# Patient Record
Sex: Male | Born: 1960 | Race: Black or African American | Hispanic: No | Marital: Married | Smoking: Never smoker
Health system: Southern US, Community
[De-identification: ages and names within clinical notes are randomized; demographics above are authoritative.]

## PROBLEM LIST (undated history)

## (undated) DIAGNOSIS — M1990 Unspecified osteoarthritis, unspecified site: Secondary | ICD-10-CM

## (undated) DIAGNOSIS — K219 Gastro-esophageal reflux disease without esophagitis: Secondary | ICD-10-CM

## (undated) DIAGNOSIS — Z87442 Personal history of urinary calculi: Secondary | ICD-10-CM

## (undated) DIAGNOSIS — I10 Essential (primary) hypertension: Secondary | ICD-10-CM

## (undated) DIAGNOSIS — E785 Hyperlipidemia, unspecified: Secondary | ICD-10-CM

## (undated) DIAGNOSIS — D649 Anemia, unspecified: Secondary | ICD-10-CM

## (undated) HISTORY — DX: Hyperlipidemia, unspecified: E78.5

## (undated) HISTORY — DX: Unspecified osteoarthritis, unspecified site: M19.90

## (undated) HISTORY — DX: Gastro-esophageal reflux disease without esophagitis: K21.9

## (undated) HISTORY — PX: WISDOM TOOTH EXTRACTION: SHX21

## (undated) HISTORY — PX: OTHER SURGICAL HISTORY: SHX169

---

## 2004-07-22 ENCOUNTER — Ambulatory Visit (HOSPITAL_COMMUNITY): Admission: RE | Admit: 2004-07-22 | Discharge: 2004-07-22 | Payer: Self-pay | Admitting: *Deleted

## 2014-07-10 DIAGNOSIS — M069 Rheumatoid arthritis, unspecified: Secondary | ICD-10-CM | POA: Insufficient documentation

## 2014-07-10 DIAGNOSIS — I10 Essential (primary) hypertension: Secondary | ICD-10-CM | POA: Insufficient documentation

## 2014-07-10 DIAGNOSIS — J302 Other seasonal allergic rhinitis: Secondary | ICD-10-CM | POA: Insufficient documentation

## 2014-08-16 ENCOUNTER — Encounter: Payer: Self-pay | Admitting: Internal Medicine

## 2014-08-16 ENCOUNTER — Ambulatory Visit (INDEPENDENT_AMBULATORY_CARE_PROVIDER_SITE_OTHER): Payer: Managed Care, Other (non HMO) | Admitting: Internal Medicine

## 2014-08-16 DIAGNOSIS — Z9189 Other specified personal risk factors, not elsewhere classified: Secondary | ICD-10-CM

## 2014-08-16 DIAGNOSIS — Z23 Encounter for immunization: Secondary | ICD-10-CM

## 2014-08-16 MED ORDER — CIPROFLOXACIN HCL 500 MG PO TABS: 500.0000 mg | ORAL_TABLET | Freq: Two times a day (BID) | ORAL | Status: DC

## 2014-08-16 NOTE — Progress Notes (Signed)
   Subjective:    Patient ID: Alex Townsend, male    DOB: May 21, 1961, 53 y.o.   MRN: [id number]  HPI 52yo M with upcoming trip to [place] and [place]. He will spend [place] in [place]. Also planning to go to [place] next year  All : nkma Meds: remicade, leflunomide, crestor, fenofibrate, micardis, clonidine, bystolic, amlodipine  Pmhx: RA, hypercholesteremia, GERD  Previous travel to [place] and [place] Prior vax = flu vaccine, tdap in 2013    Review of Systems     Objective:   Physical Exam        Assessment & Plan:  imms = recommended hep a and typhoid, he is interested in getting Hep A #1 now. No need for yellow fever on this itinerary. Recommend he gets flu vaccine this winter  Malaria proph = not needed on this itinerary. But recommend to use deet  Travelers diarrhea = will give rx for cipro

## 2014-12-25 DIAGNOSIS — D509 Iron deficiency anemia, unspecified: Secondary | ICD-10-CM | POA: Insufficient documentation

## 2015-02-06 DIAGNOSIS — N401 Enlarged prostate with lower urinary tract symptoms: Secondary | ICD-10-CM | POA: Insufficient documentation

## 2015-02-21 ENCOUNTER — Ambulatory Visit (INDEPENDENT_AMBULATORY_CARE_PROVIDER_SITE_OTHER): Payer: Managed Care, Other (non HMO) | Admitting: *Deleted

## 2015-02-21 DIAGNOSIS — Z23 Encounter for immunization: Secondary | ICD-10-CM

## 2015-07-31 DIAGNOSIS — E349 Endocrine disorder, unspecified: Secondary | ICD-10-CM | POA: Insufficient documentation

## 2015-10-07 DIAGNOSIS — F5104 Psychophysiologic insomnia: Secondary | ICD-10-CM | POA: Insufficient documentation

## 2015-10-07 DIAGNOSIS — G4733 Obstructive sleep apnea (adult) (pediatric): Secondary | ICD-10-CM | POA: Insufficient documentation

## 2016-08-03 DIAGNOSIS — E785 Hyperlipidemia, unspecified: Secondary | ICD-10-CM | POA: Insufficient documentation

## 2016-11-23 DIAGNOSIS — N200 Calculus of kidney: Secondary | ICD-10-CM | POA: Insufficient documentation

## 2017-04-07 DIAGNOSIS — R232 Flushing: Secondary | ICD-10-CM | POA: Insufficient documentation

## 2019-05-10 DIAGNOSIS — M4800 Spinal stenosis, site unspecified: Secondary | ICD-10-CM | POA: Insufficient documentation

## 2019-05-17 DIAGNOSIS — M5416 Radiculopathy, lumbar region: Secondary | ICD-10-CM | POA: Insufficient documentation

## 2019-06-05 DIAGNOSIS — Z0000 Encounter for general adult medical examination without abnormal findings: Secondary | ICD-10-CM | POA: Insufficient documentation

## 2021-03-06 ENCOUNTER — Other Ambulatory Visit: Payer: Self-pay | Admitting: Neurosurgery

## 2021-03-11 NOTE — Pre-Procedure Instructions (Signed)
Surgical Instructions:    Your procedure is scheduled on Monday, 03/16/21 (11 AM- 2:13 PM).  Report to [facility] Main Entrance "A" at 09:00 A.M., then check in with the Admitting office.  Call this number if you have any questions prior to, or have any problems the morning of surgery:  [phone]    Remember:  Do not eat or drink after midnight the night before your surgery.      Take these medicines the morning of surgery with A SIP OF WATER: atorvastatin (LIPITOR) cetirizine (ZYRTEC) hydrALAZINE (APRESOLINE) labetalol (NORMODYNE) NIFEdipine (ADALAT CC)    STOP leflunomide (ARAVA), and RINVOQ 3 days prior to surgery.   As of today, STOP taking any Aspirin (unless otherwise instructed by your surgeon) Aleve, Naproxen, Ibuprofen, Motrin, Advil, Goody's, BC's, all herbal medications, fish oil, and all vitamins.              Special instructions:   [place]- Preparing For Surgery  Before surgery, you can play an important role. Because skin is not sterile, your skin needs to be as free of germs as possible. You can reduce the number of germs on your skin by washing with CHG (chlorahexidine gluconate) Soap before surgery.  CHG is an antiseptic cleaner which kills germs and bonds with the skin to continue killing germs even after washing.    Oral Hygiene is also important to reduce your risk of infection.  Remember - BRUSH YOUR TEETH THE MORNING OF SURGERY WITH YOUR REGULAR TOOTHPASTE  Please do not use if you have an allergy to CHG or antibacterial soaps. If your skin becomes reddened/irritated stop using the CHG.  Do not shave (including legs and underarms) for at least 48 hours prior to first CHG shower. It is OK to shave your face.  Please follow these instructions carefully.   1. Shower the NIGHT BEFORE SURGERY and the MORNING OF SURGERY  2. If you chose to wash your hair, wash your hair first as usual with your normal shampoo.  3. After you shampoo, rinse your  hair and body thoroughly to remove the shampoo.  4. Wash Face and genitals (private parts) with your normal soap.   5. Use CHG Soap as you would any other liquid soap. You can apply CHG directly to the skin and wash gently with a scrungie or a clean washcloth.   6. Apply the CHG Soap to your body ONLY FROM THE NECK DOWN.  Do not use on open wounds or open sores. Avoid contact with your eyes, ears, mouth and genitals (private parts). Wash Face and genitals (private parts)  with your normal soap.   7. Wash thoroughly, paying special attention to the area where your surgery will be performed.  8. Thoroughly rinse your body with warm water from the neck down.  9. DO NOT shower/wash with your normal soap after using and rinsing off the CHG Soap.  10. Pat yourself dry with a CLEAN TOWEL.  11. Wear CLEAN PAJAMAS to bed the night before surgery.  12. Place CLEAN SHEETS on your bed the night before your surgery.  13. DO NOT SLEEP WITH PETS.   Day of Surgery: SHOWER with CHG soap. Brush your teeth WITH YOUR REGULAR TOOTHPASTE. Wear Clean/Comfortable clothing the morning of surgery. Do not apply any deodorants/lotions.   Do not wear jewelry. Men may shave face and neck.  Do NOT Smoke (Tobacco/Vaping) or drink Alcohol 24 hours prior to your procedure. Do not bring valuables to the hospital. [facility] is not responsible for any belongings or valuables.  If you use a CPAP at night, you may bring all equipment for your overnight stay.   Contacts, glasses, or dentures may not be worn into surgery, please bring cases for these belongings.   For patients admitted to the hospital, discharge time will be determined by your treatment team.   Patients discharged the day of surgery will not be allowed to drive home, and someone needs to stay with them for 24 hours.    Please read over the following fact sheets that you were given.

## 2021-03-12 ENCOUNTER — Other Ambulatory Visit: Payer: Self-pay

## 2021-03-12 ENCOUNTER — Encounter (HOSPITAL_COMMUNITY)
Admission: RE | Admit: 2021-03-12 | Discharge: 2021-03-12 | Disposition: A | Discharge status: Home / Self Care | Source: Ambulatory Visit | Attending: Neurosurgery | Admitting: Neurosurgery

## 2021-03-12 ENCOUNTER — Encounter (HOSPITAL_COMMUNITY): Payer: Self-pay

## 2021-03-12 DIAGNOSIS — Z20822 Contact with and (suspected) exposure to covid-19: Secondary | ICD-10-CM | POA: Diagnosis not present

## 2021-03-12 DIAGNOSIS — Z01818 Encounter for other preprocedural examination: Secondary | ICD-10-CM | POA: Insufficient documentation

## 2021-03-12 HISTORY — DX: Personal history of urinary calculi: Z87.442

## 2021-03-12 HISTORY — DX: Essential (primary) hypertension: I10

## 2021-03-12 HISTORY — DX: Anemia, unspecified: D64.9

## 2021-03-12 LAB — BASIC METABOLIC PANEL
Anion gap: 6 (ref 5–15)
BUN: 16 mg/dL (ref 6–20)
CO2: 26 mmol/L (ref 22–32)
Calcium: 8.8 mg/dL — ABNORMAL LOW (ref 8.9–10.3)
Chloride: 105 mmol/L (ref 98–111)
Creatinine, Ser: 1.13 mg/dL (ref 0.61–1.24)
GFR, Estimated: >60 mL/min (ref >60)
Glucose, Bld: 98 mg/dL (ref 70–99)
Potassium: 3.8 mmol/L (ref 3.5–5.1)
Sodium: 137 mmol/L (ref 135–145)

## 2021-03-12 LAB — SARS CORONAVIRUS 2 (TAT 6-24 HRS): SARS Coronavirus 2: NEGATIVE

## 2021-03-12 LAB — CBC
HCT: 33.2 % — ABNORMAL LOW (ref 39.0–52.0)
Hemoglobin: 9.9 g/dL — ABNORMAL LOW (ref 13.0–17.0)
MCH: 22.6 pg — ABNORMAL LOW (ref 26.0–34.0)
MCHC: 29.8 g/dL — ABNORMAL LOW (ref 30.0–36.0)
MCV: 75.6 fL — ABNORMAL LOW (ref 80.0–100.0)
Platelets: 385 10*3/uL (ref 150–400)
RBC: 4.39 MIL/uL (ref 4.22–5.81)
RDW: 15 % (ref 11.5–15.5)
WBC: 5.7 10*3/uL (ref 4.0–10.5)
nRBC: 0 % (ref 0.0–0.2)

## 2021-03-12 LAB — TYPE AND SCREEN
ABO/RH(D): B POS
Antibody Screen: NEGATIVE

## 2021-03-12 LAB — SURGICAL PCR SCREEN
MRSA, PCR: NEGATIVE
Staphylococcus aureus: NEGATIVE

## 2021-03-12 NOTE — Progress Notes (Signed)
PCP - Bernadette Hoit Cardiologist - none Nephrologist: Mcneil Sober Urologist: Dr. Ernestine Conrad  PPM/ICD - denies   Chest x-ray - n/a EKG - 03/12/21 Stress Test - denies ECHO - denies Cardiac Cath - denies  Sleep Study - Has had a sleep study in the past. Not diagnosed with sleep apnea and never was prescribed a cpap.    No Diabetes  Patient instructed to hold all Aspirin, NSAID's, herbal medications, fish oil and vitamins 7 days prior to surgery.   STOP leflunomide (ARAVA), and RINVOQ 3 days prior to surgery.   ERAS Protcol -no   COVID TEST- 03/12/21   Anesthesia review: no  Patient denies shortness of breath, fever, cough and chest pain at PAT appointment   All instructions explained to the patient, with a verbal understanding of the material. Patient agrees to go over the instructions while at home for a better understanding. Patient also instructed to self quarantine after being tested for COVID-19. The opportunity to ask questions was provided.

## 2021-03-16 ENCOUNTER — Encounter (HOSPITAL_COMMUNITY)
Admission: RE | Disposition: A | Discharge status: Home / Self Care | Payer: Self-pay | Source: Home / Self Care | Attending: Neurosurgery

## 2021-03-16 ENCOUNTER — Inpatient Hospital Stay (HOSPITAL_COMMUNITY): Admitting: Certified Registered"

## 2021-03-16 ENCOUNTER — Ambulatory Visit (HOSPITAL_COMMUNITY)
Admission: RE | Admit: 2021-03-16 | Discharge: 2021-03-17 | Disposition: A | Discharge status: Home / Self Care | Attending: Neurosurgery | Admitting: Neurosurgery

## 2021-03-16 ENCOUNTER — Other Ambulatory Visit: Payer: Self-pay

## 2021-03-16 ENCOUNTER — Encounter (HOSPITAL_COMMUNITY): Payer: Self-pay | Admitting: Neurosurgery

## 2021-03-16 ENCOUNTER — Inpatient Hospital Stay (HOSPITAL_COMMUNITY)

## 2021-03-16 DIAGNOSIS — Z880 Allergy status to penicillin: Secondary | ICD-10-CM | POA: Insufficient documentation

## 2021-03-16 DIAGNOSIS — M532X6 Spinal instabilities, lumbar region: Secondary | ICD-10-CM | POA: Diagnosis not present

## 2021-03-16 DIAGNOSIS — M48061 Spinal stenosis, lumbar region without neurogenic claudication: Secondary | ICD-10-CM | POA: Diagnosis not present

## 2021-03-16 DIAGNOSIS — Z888 Allergy status to other drugs, medicaments and biological substances status: Secondary | ICD-10-CM | POA: Diagnosis not present

## 2021-03-16 DIAGNOSIS — M5126 Other intervertebral disc displacement, lumbar region: Secondary | ICD-10-CM | POA: Diagnosis not present

## 2021-03-16 DIAGNOSIS — Z79899 Other long term (current) drug therapy: Secondary | ICD-10-CM | POA: Diagnosis not present

## 2021-03-16 DIAGNOSIS — Z419 Encounter for procedure for purposes other than remedying health state, unspecified: Secondary | ICD-10-CM

## 2021-03-16 LAB — ABO/RH: ABO/RH(D): B POS

## 2021-03-16 SURGERY — POSTERIOR LUMBAR FUSION 1 LEVEL
Anesthesia: General | Site: Back

## 2021-03-16 MED ORDER — SODIUM CHLORIDE 0.9% FLUSH: 3.0000 mL | INTRAVENOUS | Status: DC | PRN

## 2021-03-16 MED ORDER — ROCURONIUM BROMIDE 10 MG/ML (PF) SYRINGE
PREFILLED_SYRINGE | INTRAVENOUS | Status: AC
  Filled 2021-03-16: qty 10

## 2021-03-16 MED ORDER — HYDRALAZINE HCL 10 MG PO TABS
50.0000 mg | ORAL_TABLET | Freq: Three times a day (TID) | ORAL | Status: DC
  Administered 2021-03-16 – 2021-03-17 (×3): 50 mg via ORAL
  Filled 2021-03-16 (×3): qty 5

## 2021-03-16 MED ORDER — CEFAZOLIN SODIUM-DEXTROSE 2-4 GM/100ML-% IV SOLN
2.0000 g | Freq: Three times a day (TID) | INTRAVENOUS | Status: AC
  Administered 2021-03-16 – 2021-03-17 (×2): 2 g via INTRAVENOUS
  Filled 2021-03-16 (×2): qty 100

## 2021-03-16 MED ORDER — FENTANYL CITRATE (PF) 100 MCG/2ML IJ SOLN
INTRAMUSCULAR | Status: AC
  Filled 2021-03-16: qty 2

## 2021-03-16 MED ORDER — TRAZODONE HCL 50 MG PO TABS
50.0000 mg | ORAL_TABLET | Freq: Every day | ORAL | Status: DC
  Administered 2021-03-16: 50 mg via ORAL
  Filled 2021-03-16 (×2): qty 1

## 2021-03-16 MED ORDER — PHENYLEPHRINE HCL-NACL 10-0.9 MG/250ML-% IV SOLN
INTRAVENOUS | Status: DC | PRN
  Administered 2021-03-16: 25 ug/min via INTRAVENOUS

## 2021-03-16 MED ORDER — CHLORHEXIDINE GLUCONATE CLOTH 2 % EX PADS: 6.0000 | MEDICATED_PAD | Freq: Once | CUTANEOUS | Status: DC

## 2021-03-16 MED ORDER — VITAMIN D 25 MCG (1000 UNIT) PO TABS
1000.0000 [IU] | ORAL_TABLET | Freq: Every day | ORAL | Status: DC
  Administered 2021-03-17: 1000 [IU] via ORAL
  Filled 2021-03-16: qty 1

## 2021-03-16 MED ORDER — BUPIVACAINE LIPOSOME 1.3 % IJ SUSP
INTRAMUSCULAR | Status: DC | PRN
  Administered 2021-03-16: 20 mL

## 2021-03-16 MED ORDER — LABETALOL HCL 200 MG PO TABS
200.0000 mg | ORAL_TABLET | Freq: Two times a day (BID) | ORAL | Status: DC
  Administered 2021-03-16 – 2021-03-17 (×2): 200 mg via ORAL
  Filled 2021-03-16 (×3): qty 1

## 2021-03-16 MED ORDER — CEFAZOLIN SODIUM-DEXTROSE 2-4 GM/100ML-% IV SOLN
2.0000 g | INTRAVENOUS | Status: AC
  Administered 2021-03-16: 2 g via INTRAVENOUS

## 2021-03-16 MED ORDER — LORATADINE 10 MG PO TABS
10.0000 mg | ORAL_TABLET | Freq: Every day | ORAL | Status: DC
  Administered 2021-03-17: 10 mg via ORAL
  Filled 2021-03-16: qty 1

## 2021-03-16 MED ORDER — SODIUM CHLORIDE 0.9% FLUSH: 3.0000 mL | Freq: Two times a day (BID) | INTRAVENOUS | Status: DC

## 2021-03-16 MED ORDER — IRBESARTAN 150 MG PO TABS
75.0000 mg | ORAL_TABLET | Freq: Every day | ORAL | Status: DC
  Administered 2021-03-17: 75 mg via ORAL
  Filled 2021-03-16: qty 1

## 2021-03-16 MED ORDER — LIDOCAINE 2% (20 MG/ML) 5 ML SYRINGE
INTRAMUSCULAR | Status: AC
  Filled 2021-03-16: qty 5

## 2021-03-16 MED ORDER — CHLORHEXIDINE GLUCONATE 0.12 % MT SOLN: 15.0000 mL | Freq: Once | OROMUCOSAL | Status: AC

## 2021-03-16 MED ORDER — MAGNESIUM OXIDE -MG SUPPLEMENT 400 (240 MG) MG PO TABS
400.0000 mg | ORAL_TABLET | Freq: Every day | ORAL | Status: DC
  Administered 2021-03-17: 400 mg via ORAL
  Filled 2021-03-16: qty 1

## 2021-03-16 MED ORDER — LACTATED RINGERS IV SOLN: INTRAVENOUS | Status: DC

## 2021-03-16 MED ORDER — LIDOCAINE 2% (20 MG/ML) 5 ML SYRINGE
INTRAMUSCULAR | Status: DC | PRN
  Administered 2021-03-16: 60 mg via INTRAVENOUS

## 2021-03-16 MED ORDER — HYDROMORPHONE HCL 1 MG/ML IJ SOLN
INTRAMUSCULAR | Status: AC
  Filled 2021-03-16: qty 1

## 2021-03-16 MED ORDER — OXYCODONE HCL 5 MG PO TABS
ORAL_TABLET | ORAL | Status: AC
  Filled 2021-03-16: qty 1

## 2021-03-16 MED ORDER — ACETAMINOPHEN 650 MG RE SUPP: 650.0000 mg | RECTAL | Status: DC | PRN

## 2021-03-16 MED ORDER — SUFENTANIL CITRATE 50 MCG/ML IV SOLN
INTRAVENOUS | Status: DC | PRN
  Administered 2021-03-16: 20 ug via INTRAVENOUS

## 2021-03-16 MED ORDER — MIDAZOLAM HCL 2 MG/2ML IJ SOLN
INTRAMUSCULAR | Status: AC
  Filled 2021-03-16: qty 2

## 2021-03-16 MED ORDER — ATORVASTATIN CALCIUM 40 MG PO TABS
40.0000 mg | ORAL_TABLET | Freq: Every day | ORAL | Status: DC
  Administered 2021-03-17: 40 mg via ORAL
  Filled 2021-03-16: qty 1

## 2021-03-16 MED ORDER — ALUM & MAG HYDROXIDE-SIMETH 200-200-20 MG/5ML PO SUSP: 30.0000 mL | Freq: Four times a day (QID) | ORAL | Status: DC | PRN

## 2021-03-16 MED ORDER — CEFAZOLIN SODIUM-DEXTROSE 2-4 GM/100ML-% IV SOLN
INTRAVENOUS | Status: AC
  Filled 2021-03-16: qty 100

## 2021-03-16 MED ORDER — PHENOL 1.4 % MT LIQD: 1.0000 | OROMUCOSAL | Status: DC | PRN

## 2021-03-16 MED ORDER — EPHEDRINE SULFATE-NACL 50-0.9 MG/10ML-% IV SOSY
PREFILLED_SYRINGE | INTRAVENOUS | Status: DC | PRN
  Administered 2021-03-16 (×2): 5 mg via INTRAVENOUS

## 2021-03-16 MED ORDER — HYDROMORPHONE HCL 1 MG/ML IJ SOLN
0.5000 mg | INTRAMUSCULAR | Status: DC | PRN
  Administered 2021-03-17: 0.5 mg via INTRAVENOUS
  Filled 2021-03-16: qty 0.5

## 2021-03-16 MED ORDER — ROCURONIUM BROMIDE 10 MG/ML (PF) SYRINGE
PREFILLED_SYRINGE | INTRAVENOUS | Status: DC | PRN
  Administered 2021-03-16: 50 mg via INTRAVENOUS

## 2021-03-16 MED ORDER — THROMBIN 20000 UNITS EX SOLR
CUTANEOUS | Status: DC | PRN
  Administered 2021-03-16: 20 mL via TOPICAL

## 2021-03-16 MED ORDER — FENTANYL CITRATE (PF) 100 MCG/2ML IJ SOLN
25.0000 ug | INTRAMUSCULAR | Status: DC | PRN
  Administered 2021-03-16 (×3): 50 ug via INTRAVENOUS

## 2021-03-16 MED ORDER — OXYCODONE HCL 5 MG PO TABS
5.0000 mg | ORAL_TABLET | Freq: Once | ORAL | Status: AC | PRN
  Administered 2021-03-16: 5 mg via ORAL

## 2021-03-16 MED ORDER — UPADACITINIB ER 15 MG PO TB24: 15.0000 mg | ORAL_TABLET | Freq: Every day | ORAL | Status: DC

## 2021-03-16 MED ORDER — PANTOPRAZOLE SODIUM 40 MG PO TBEC
40.0000 mg | DELAYED_RELEASE_TABLET | Freq: Every day | ORAL | Status: DC
  Administered 2021-03-16: 40 mg via ORAL
  Filled 2021-03-16: qty 1

## 2021-03-16 MED ORDER — MENTHOL 3 MG MT LOZG: 1.0000 | LOZENGE | OROMUCOSAL | Status: DC | PRN

## 2021-03-16 MED ORDER — HYDROMORPHONE HCL 1 MG/ML IJ SOLN
0.2500 mg | INTRAMUSCULAR | Status: DC | PRN
  Administered 2021-03-16 (×2): 0.5 mg via INTRAVENOUS

## 2021-03-16 MED ORDER — ORAL CARE MOUTH RINSE: 15.0000 mL | Freq: Once | OROMUCOSAL | Status: AC

## 2021-03-16 MED ORDER — BUPIVACAINE HCL (PF) 0.25 % IJ SOLN
INTRAMUSCULAR | Status: AC
  Filled 2021-03-16: qty 30

## 2021-03-16 MED ORDER — NIFEDIPINE ER OSMOTIC RELEASE 90 MG PO TB24
90.0000 mg | ORAL_TABLET | Freq: Every day | ORAL | Status: DC
  Administered 2021-03-17: 90 mg via ORAL
  Filled 2021-03-16: qty 1

## 2021-03-16 MED ORDER — ONDANSETRON HCL 4 MG/2ML IJ SOLN
INTRAMUSCULAR | Status: DC | PRN
  Administered 2021-03-16: 4 mg via INTRAVENOUS

## 2021-03-16 MED ORDER — LACTATED RINGERS IV SOLN: INTRAVENOUS | Status: DC | PRN

## 2021-03-16 MED ORDER — THROMBIN 5000 UNITS EX SOLR
CUTANEOUS | Status: AC
  Filled 2021-03-16: qty 5000

## 2021-03-16 MED ORDER — DEXAMETHASONE SODIUM PHOSPHATE 10 MG/ML IJ SOLN
10.0000 mg | Freq: Once | INTRAMUSCULAR | Status: AC
  Administered 2021-03-16: 10 mg via INTRAVENOUS

## 2021-03-16 MED ORDER — OXYCODONE HCL 5 MG/5ML PO SOLN: 5.0000 mg | Freq: Once | ORAL | Status: AC | PRN

## 2021-03-16 MED ORDER — SUGAMMADEX SODIUM 200 MG/2ML IV SOLN
INTRAVENOUS | Status: DC | PRN
  Administered 2021-03-16: 200 mg via INTRAVENOUS

## 2021-03-16 MED ORDER — ACETAMINOPHEN 325 MG PO TABS
650.0000 mg | ORAL_TABLET | ORAL | Status: DC | PRN
  Administered 2021-03-16 – 2021-03-17 (×2): 650 mg via ORAL
  Filled 2021-03-16 (×2): qty 2

## 2021-03-16 MED ORDER — PANTOPRAZOLE SODIUM 40 MG IV SOLR: 40.0000 mg | Freq: Every day | INTRAVENOUS | Status: DC

## 2021-03-16 MED ORDER — PROPOFOL 10 MG/ML IV BOLUS
INTRAVENOUS | Status: DC | PRN
  Administered 2021-03-16: 200 mg via INTRAVENOUS

## 2021-03-16 MED ORDER — ONDANSETRON HCL 4 MG/2ML IJ SOLN
INTRAMUSCULAR | Status: AC
  Filled 2021-03-16: qty 2

## 2021-03-16 MED ORDER — 0.9 % SODIUM CHLORIDE (POUR BTL) OPTIME
TOPICAL | Status: DC | PRN
  Administered 2021-03-16: 1000 mL

## 2021-03-16 MED ORDER — SODIUM CHLORIDE 0.9 % IV SOLN
250.0000 mL | INTRAVENOUS | Status: DC
  Administered 2021-03-16: 250 mL via INTRAVENOUS

## 2021-03-16 MED ORDER — CYCLOBENZAPRINE HCL 10 MG PO TABS
ORAL_TABLET | ORAL | Status: AC
  Filled 2021-03-16: qty 1

## 2021-03-16 MED ORDER — AMILORIDE HCL 5 MG PO TABS
5.0000 mg | ORAL_TABLET | Freq: Every day | ORAL | Status: DC
  Administered 2021-03-17: 5 mg via ORAL
  Filled 2021-03-16: qty 1

## 2021-03-16 MED ORDER — PROMETHAZINE HCL 25 MG/ML IJ SOLN: 6.2500 mg | INTRAMUSCULAR | Status: DC | PRN

## 2021-03-16 MED ORDER — CYCLOBENZAPRINE HCL 10 MG PO TABS
10.0000 mg | ORAL_TABLET | Freq: Three times a day (TID) | ORAL | Status: DC | PRN
  Administered 2021-03-16 – 2021-03-17 (×2): 10 mg via ORAL
  Filled 2021-03-16: qty 1

## 2021-03-16 MED ORDER — ONDANSETRON HCL 4 MG PO TABS: 4.0000 mg | ORAL_TABLET | Freq: Four times a day (QID) | ORAL | Status: DC | PRN

## 2021-03-16 MED ORDER — BUPIVACAINE LIPOSOME 1.3 % IJ SUSP
INTRAMUSCULAR | Status: AC
  Filled 2021-03-16: qty 20

## 2021-03-16 MED ORDER — LEFLUNOMIDE 20 MG PO TABS
20.0000 mg | ORAL_TABLET | Freq: Every day | ORAL | Status: DC
  Filled 2021-03-16: qty 1

## 2021-03-16 MED ORDER — DEXAMETHASONE SODIUM PHOSPHATE 10 MG/ML IJ SOLN
INTRAMUSCULAR | Status: AC
  Filled 2021-03-16: qty 1

## 2021-03-16 MED ORDER — EPHEDRINE 5 MG/ML INJ
INTRAVENOUS | Status: AC
  Filled 2021-03-16: qty 10

## 2021-03-16 MED ORDER — PROPOFOL 10 MG/ML IV BOLUS
INTRAVENOUS | Status: AC
  Filled 2021-03-16: qty 20

## 2021-03-16 MED ORDER — SUFENTANIL CITRATE 50 MCG/ML IV SOLN
INTRAVENOUS | Status: AC
  Filled 2021-03-16: qty 1

## 2021-03-16 MED ORDER — CHLORHEXIDINE GLUCONATE 0.12 % MT SOLN
OROMUCOSAL | Status: AC
  Administered 2021-03-16: 15 mL via OROMUCOSAL
  Filled 2021-03-16: qty 15

## 2021-03-16 MED ORDER — THROMBIN 20000 UNITS EX SOLR
CUTANEOUS | Status: AC
  Filled 2021-03-16: qty 20000

## 2021-03-16 MED ORDER — OXYCODONE HCL 5 MG PO TABS
10.0000 mg | ORAL_TABLET | ORAL | Status: DC | PRN
  Administered 2021-03-16 – 2021-03-17 (×6): 10 mg via ORAL
  Filled 2021-03-16 (×6): qty 2

## 2021-03-16 MED ORDER — MIDAZOLAM HCL 2 MG/2ML IJ SOLN
INTRAMUSCULAR | Status: DC | PRN
  Administered 2021-03-16: 2 mg via INTRAVENOUS

## 2021-03-16 MED ORDER — ONDANSETRON HCL 4 MG/2ML IJ SOLN: 4.0000 mg | Freq: Four times a day (QID) | INTRAMUSCULAR | Status: DC | PRN

## 2021-03-16 SURGICAL SUPPLY — 75 items
BASKET BONE COLLECTION (BASKET) ×2 IMPLANT
BENZOIN TINCTURE PRP APPL 2/3 (GAUZE/BANDAGES/DRESSINGS) ×2 IMPLANT
BLADE CLIPPER SURG (BLADE) ×2 IMPLANT
BLADE SURG 11 STRL SS (BLADE) ×2 IMPLANT
BONE VIVIGEN FORMABLE 5.4CC (Bone Implant) ×2 IMPLANT
BUR CUTTER 7.0 ROUND (BURR) ×2 IMPLANT
BUR MATCHSTICK NEURO 3.0 LAGG (BURR) ×2 IMPLANT
CANISTER SUCT 3000ML PPV (MISCELLANEOUS) ×2 IMPLANT
CAP LOCKING THREADED (Cap) ×8 IMPLANT
CARTRIDGE OIL MAESTRO DRILL (MISCELLANEOUS) ×1 IMPLANT
CNTNR URN SCR LID CUP LEK RST (MISCELLANEOUS) ×1 IMPLANT
CONT SPEC 4OZ STRL OR WHT (MISCELLANEOUS) ×2
COVER BACK TABLE 60X90IN (DRAPES) ×2 IMPLANT
COVER WAND RF STERILE (DRAPES) ×2 IMPLANT
DECANTER SPIKE VIAL GLASS SM (MISCELLANEOUS) ×2 IMPLANT
DERMABOND ADVANCED (GAUZE/BANDAGES/DRESSINGS) ×1
DERMABOND ADVANCED .7 DNX12 (GAUZE/BANDAGES/DRESSINGS) ×1 IMPLANT
DIFFUSER DRILL AIR PNEUMATIC (MISCELLANEOUS) ×2 IMPLANT
DRAPE C-ARM 42X72 X-RAY (DRAPES) ×4 IMPLANT
DRAPE C-ARMOR (DRAPES) IMPLANT
DRAPE HALF SHEET 40X57 (DRAPES) IMPLANT
DRAPE LAPAROTOMY 100X72X124 (DRAPES) ×2 IMPLANT
DRAPE SURG 17X23 STRL (DRAPES) ×2 IMPLANT
DRSG OPSITE 4X5.5 SM (GAUZE/BANDAGES/DRESSINGS) ×2 IMPLANT
DRSG OPSITE POSTOP 4X6 (GAUZE/BANDAGES/DRESSINGS) ×2 IMPLANT
DURAPREP 26ML APPLICATOR (WOUND CARE) ×2 IMPLANT
ELECT REM PT RETURN 9FT ADLT (ELECTROSURGICAL) ×2
ELECTRODE REM PT RTRN 9FT ADLT (ELECTROSURGICAL) ×1 IMPLANT
EVACUATOR 3/16  PVC DRAIN (DRAIN)
EVACUATOR 3/16 PVC DRAIN (DRAIN) IMPLANT
GAUZE 4X4 16PLY RFD (DISPOSABLE) ×2 IMPLANT
GAUZE SPONGE 4X4 12PLY STRL (GAUZE/BANDAGES/DRESSINGS) ×2 IMPLANT
GLOVE BIO SURGEON STRL SZ7 (GLOVE) ×2 IMPLANT
GLOVE BIO SURGEON STRL SZ8 (GLOVE) ×4 IMPLANT
GLOVE EXAM NITRILE XL STR (GLOVE) IMPLANT
GLOVE INDICATOR 8.5 STRL (GLOVE) ×4 IMPLANT
GLOVE SRG 8 PF TXTR STRL LF DI (GLOVE) ×1 IMPLANT
GLOVE SURG UNDER POLY LF SZ6 (GLOVE) ×4 IMPLANT
GLOVE SURG UNDER POLY LF SZ6.5 (GLOVE) ×4 IMPLANT
GLOVE SURG UNDER POLY LF SZ7 (GLOVE) ×6 IMPLANT
GLOVE SURG UNDER POLY LF SZ7.5 (GLOVE) ×6 IMPLANT
GLOVE SURG UNDER POLY LF SZ8 (GLOVE) ×2
GOWN STRL REUS W/ TWL LRG LVL3 (GOWN DISPOSABLE) ×4 IMPLANT
GOWN STRL REUS W/ TWL XL LVL3 (GOWN DISPOSABLE) ×3 IMPLANT
GOWN STRL REUS W/TWL 2XL LVL3 (GOWN DISPOSABLE) IMPLANT
GOWN STRL REUS W/TWL LRG LVL3 (GOWN DISPOSABLE) ×8
GOWN STRL REUS W/TWL XL LVL3 (GOWN DISPOSABLE) ×6
GRAFT BONE PROTEIOS LRG 5CC (Orthopedic Implant) ×2 IMPLANT
HEMOSTAT POWDER KIT SURGIFOAM (HEMOSTASIS) ×2 IMPLANT
KIT BASIN OR (CUSTOM PROCEDURE TRAY) ×2 IMPLANT
KIT TURNOVER KIT B (KITS) ×2 IMPLANT
MILL MEDIUM DISP (BLADE) ×2 IMPLANT
NEEDLE HYPO 21X1.5 SAFETY (NEEDLE) ×2 IMPLANT
NEEDLE HYPO 25X1 1.5 SAFETY (NEEDLE) ×2 IMPLANT
NS IRRIG 1000ML POUR BTL (IV SOLUTION) ×2 IMPLANT
OIL CARTRIDGE MAESTRO DRILL (MISCELLANEOUS) ×2
PACK LAMINECTOMY NEURO (CUSTOM PROCEDURE TRAY) ×2 IMPLANT
PAD ARMBOARD 7.5X6 YLW CONV (MISCELLANEOUS) ×6 IMPLANT
ROD TI CVD 5.5X40 (Rod) ×4 IMPLANT
SCREW PA THRD CREO TULIP 5.5X4 (Head) ×8 IMPLANT
SHAFT CREO 30MM (Neuro Prosthesis/Implant) ×8 IMPLANT
SPACER TI SUSTAIN RT 10X26 8D (Spacer) ×4 IMPLANT
SPONGE LAP 4X18 RFD (DISPOSABLE) IMPLANT
SPONGE SURGIFOAM ABS GEL 100 (HEMOSTASIS) ×2 IMPLANT
STRIP CLOSURE SKIN 1/2X4 (GAUZE/BANDAGES/DRESSINGS) ×2 IMPLANT
SUT VIC AB 0 CT1 18XCR BRD8 (SUTURE) ×2 IMPLANT
SUT VIC AB 0 CT1 8-18 (SUTURE) ×4
SUT VIC AB 2-0 CT1 18 (SUTURE) ×2 IMPLANT
SUT VIC AB 4-0 PS2 27 (SUTURE) ×2 IMPLANT
SYR 20CC LL (SYRINGE) ×2 IMPLANT
SYR 3ML LL SCALE MARK (SYRINGE) ×2 IMPLANT
TOWEL GREEN STERILE (TOWEL DISPOSABLE) ×2 IMPLANT
TOWEL GREEN STERILE FF (TOWEL DISPOSABLE) ×2 IMPLANT
TRAY FOLEY MTR SLVR 16FR STAT (SET/KITS/TRAYS/PACK) ×2 IMPLANT
WATER STERILE IRR 1000ML POUR (IV SOLUTION) ×2 IMPLANT

## 2021-03-16 NOTE — Anesthesia Postprocedure Evaluation (Signed)
Anesthesia Post Note  Patient: Alex Townsend  Procedure(s) Performed: Posterior Lumbar Interbody Fusion  - Lumbar four-Lumbar five (N/A Back)     Patient location during evaluation: PACU Anesthesia Type: General Level of consciousness: awake and alert Pain management: pain level controlled Vital Signs Assessment: post-procedure vital signs reviewed and stable Respiratory status: spontaneous breathing, nonlabored ventilation and respiratory function stable Cardiovascular status: blood pressure returned to baseline and stable Postop Assessment: no apparent nausea or vomiting Anesthetic complications: no   No complications documented.  Last Vitals:  Vitals:   03/16/21 1440 03/16/21 1520  BP: (!) 163/92 (!) 144/89  Pulse: 66 71  Resp: (!) 22 20  Temp: 36.7 C 36.6 C  SpO2: 100% 98%    Last Pain:  Vitals:   03/16/21 1440  TempSrc:   PainSc: Asleep                 Beryle Lathe

## 2021-03-16 NOTE — Progress Notes (Signed)
Orthopedic Tech Progress Note Patient Details:  Alex Townsend July 27, 1961 [id number] RN stated "patient has back brace" Patient ID: Alex Townsend, male   DOB: 1961/04/13, 60 y.o.   MRN: [id number]   Alex Townsend 03/16/2021, 5:47 PM

## 2021-03-16 NOTE — Anesthesia Preprocedure Evaluation (Addendum)
Anesthesia Evaluation  Patient identified by MRN, date of birth, ID band Patient awake    Reviewed: Allergy & Precautions, NPO status , Patient's Chart, lab work & pertinent test results, reviewed documented beta blocker date and time   History of Anesthesia Complications Negative for: history of anesthetic complications  Airway Mallampati: III  TM Distance: >3 FB Neck ROM: Full    Dental  (+) Dental Advisory Given, Chipped   Pulmonary neg pulmonary ROS,    Pulmonary exam normal        Cardiovascular hypertension, Pt. on medications and Pt. on home beta blockers Normal cardiovascular exam     Neuro/Psych negative neurological ROS  negative psych ROS   GI/Hepatic Neg liver ROS, GERD  Controlled,  Endo/Other   Obesity   Renal/GU negative Renal ROS     Musculoskeletal  (+) Arthritis ,   Abdominal   Peds  Hematology  (+) anemia ,   Anesthesia Other Findings Vocal cord polyps Covid test negative   Reproductive/Obstetrics                            Anesthesia Physical Anesthesia Plan  ASA: II  Anesthesia Plan: General   Post-op Pain Management:    Induction: Intravenous  PONV Risk Score and Plan: 3 and Treatment may vary due to age or medical condition, Ondansetron, Dexamethasone, Midazolam and Scopolamine patch - Pre-op  Airway Management Planned: Oral ETT  Additional Equipment: None  Intra-op Plan:   Post-operative Plan: Extubation in OR  Informed Consent: I have reviewed the patients History and Physical, chart, labs and discussed the procedure including the risks, benefits and alternatives for the proposed anesthesia with the patient or authorized representative who has indicated his/her understanding and acceptance.     Dental advisory given  Plan Discussed with: CRNA and Anesthesiologist  Anesthesia Plan Comments:       Anesthesia Quick Evaluation

## 2021-03-16 NOTE — H&P (Signed)
Alex Townsend is an 60 y.o. male.   Chief Complaint: Back bilateral hip and leg pain HPI: 59 years gentleman with severe back bilateral hip and leg pain rating down L4-L5 nerve root pattern with claudication.  Patient sustained a work-related injury with progressive deterioration disc herniation and instability at L4-5 and due to patient's progression of clinical syndrome imaging findings and failure of conservative treatment I recommended decompressive laminectomy interbody fusion at L4-5.  I extensively went over the risks and benefits of the operation with him as well as perioperative course expectations of outcome and alternatives of surgery and he understood and agreed to proceed forward.  Past Medical History:  Diagnosis Date  . Acid reflux   . Anemia   . Arthritis    rheumatoid arthritis  . History of kidney stones   . Hyperlipidemia   . Hypertension     Past Surgical History:  Procedure Laterality Date  . right foot dislocation    . vocal polyps    . WISDOM TOOTH EXTRACTION      History reviewed. No pertinent family history. Social History:  reports that he has never smoked. He has never used smokeless tobacco. He reports previous alcohol use. He reports previous drug use.  Allergies:  Allergies  Allergen Reactions  . Amoxicillin-Pot Clavulanate Diarrhea    Other reaction(s): GI Upset (intolerance)   . Ramipril Cough    Medications Prior to Admission  Medication Sig Dispense Refill  . aMILoride (MIDAMOR) 5 MG tablet Take 5 mg by mouth daily.    . Ascorbic Acid (VITAMIN C PO) Take 1 tablet by mouth daily.    Marland Kitchen atorvastatin (LIPITOR) 40 MG tablet Take 40 mg by mouth daily.    . cetirizine (ZYRTEC) 10 MG tablet Take 10 mg by mouth daily.    . cholecalciferol (VITAMIN D3) 25 MCG (1000 UNIT) tablet Take 1,000 Units by mouth daily.    . hydrALAZINE (APRESOLINE) 50 MG tablet Take 50 mg by mouth 3 (three) times daily.    Marland Kitchen labetalol (NORMODYNE) 200 MG tablet Take 200 mg by  mouth 2 (two) times daily.    Marland Kitchen leflunomide (ARAVA) 20 MG tablet Take 20 mg by mouth daily.    . magnesium oxide (MAG-OX) 400 MG tablet Take 400 mg by mouth daily.    Marland Kitchen NIFEdipine (ADALAT CC) 90 MG 24 hr tablet Take 90 mg by mouth daily.    Marland Kitchen RINVOQ 15 MG TB24 Take 15 mg by mouth daily.    Marland Kitchen telmisartan (MICARDIS) 80 MG tablet Take 80 mg by mouth daily.    . traZODone (DESYREL) 50 MG tablet Take 50 mg by mouth at bedtime.    . ciprofloxacin (CIPRO) 500 MG tablet Take 1 tablet (500 mg total) by mouth 2 (two) times daily. Take if needed if you have 3-4 loose stools/ 24hr, can stop taking if diarrhea resolves (Patient not taking: No sig reported) 6 tablet 0    No results found for this or any previous visit (from the past 48 hour(s)). No results found.  Review of Systems  Musculoskeletal: Positive for back pain.  Neurological: Positive for numbness.    Blood pressure (!) 165/91, pulse 76, temperature (!) 97.5 F (36.4 C), temperature source Oral, resp. rate 18, height 5' 10.5" (1.791 m), weight 107.5 kg, SpO2 96 %. Physical Exam HENT:     Head: Normocephalic.     Nose: Nose normal.  Eyes:     Pupils: Pupils are equal, round, and reactive to  light.  Cardiovascular:     Rate and Rhythm: Normal rate.     Pulses: Normal pulses.  Pulmonary:     Effort: Pulmonary effort is normal.  Abdominal:     General: Abdomen is flat.  Musculoskeletal:        General: Normal range of motion.  Skin:    General: Skin is warm.  Neurological:     General: No focal deficit present.     Mental Status: He is alert.     Comments: Strength is 5-5 iliopsoas, quads, hamstrings, gastroc, tibialis, and EHL.      Assessment/Plan 60 year old presents for decompressive laminectomy interbody fusion L4-5  Mariam Dollar, MD 03/16/2021, 9:18 AM

## 2021-03-16 NOTE — Anesthesia Procedure Notes (Signed)
Procedure Name: Intubation Date/Time: 03/16/2021 10:11 AM Performed by: Claris Che, CRNA Pre-anesthesia Checklist: Patient identified, Emergency Drugs available, Suction available and Patient being monitored Patient Re-evaluated:Patient Re-evaluated prior to induction Oxygen Delivery Method: Circle System Utilized Preoxygenation: Pre-oxygenation with 100% oxygen Induction Type: IV induction Ventilation: Mask ventilation without difficulty Laryngoscope Size: Mac and 4 Grade View: Grade II Tube type: Oral Tube size: 7.5 mm Number of attempts: 1 Airway Equipment and Method: Stylet Placement Confirmation: ETT inserted through vocal cords under direct vision,  positive ETCO2 and breath sounds checked- equal and bilateral Secured at: 23 cm Tube secured with: Tape Dental Injury: Teeth and Oropharynx as per pre-operative assessment  Comments: Atraumatic oral intubation by Pierce Crane., SRNA; Glidescope available in room.

## 2021-03-16 NOTE — Op Note (Signed)
Preoperative diagnosis: Herniated nucleus pulposus L4-5 severe spinal stenosis L4-5 instability L4-5  Postoperative diagnosis: Same  Procedure: #1 bilateral decompressive laminotomies L4-5 with complete medial facetectomies and radical foraminotomies of the L4 and L5 nerve roots bilaterally  2.  Posterior lumbar interbody fusion L4-5 utilizing the globus titanium cages inserted and rotated packed with locally harvested autograft mixed with vivigen and Proteus  3.  Cortical screw fixation L4-5 utilizing the globus Creo amp modular cortical screw set  Surgeon: Donalee Citrin  Assistant: Kendell Bane Dawley  Anesthesia: General  EBL: Minimal  HPI: 60 year old gentleman longstanding back and bilateral hip and leg pain due to a work-related injury work-up revealed a herniated disc with a free fragment on the left displacing the L4 nerve root as well as marked diastases of the facets movement on flexion-extension films and instability.  Due to patient's progression of clinical syndrome imaging findings and failed conservative treatment I recommended decompressive laminectomy and interbody fusion at L4-5 I extensively went over the risks and benefits of the operation with him as well as perioperative course expectations of outcome and alternatives to surgery and he understood and agreed to proceed forward.  Operative procedure: Patient was brought into the OR was Duson general anesthesia positioned prone Wilson frame his back was prepped and draped in routine sterile fashion.  Preoperative x-ray localized the appropriate level so Incision was made and Bovie cautery was used take down subtenons tissue and subperiosteal dissection carried lamina of L4 and L5 bilaterally intraoperative x-ray confirmed indication appropriate level so the facet joints were then drilled down laminotomies were done bilaterally preserving the spinous process and interspinous ligament.  After complete facetectomies were performed I  performed radical foraminotomies the L4 and L5 nerve roots there was marked ligamentous hypertrophy and severe hourglass compression of thecal sac all this was alleviated with a laminotomies I inspected the disc base and aggressively under bit the supra articulating facet to gain access to the lateral margin disc base.  There was a free fragment underneath the 4 root medial to the pedicle this was all faced away with a coronary dilator removed the space was then cleaned out bilaterally.  Using Epstein curettes and Scovil's paddle shavers and endplates were prepared and large amount of central disc was also removed.  With a 12 distractor in place I selected a millimeter wide 13 mm tall 8 degree implant packed with locally harvested autograft mix and after adequate endplate preparation inserted that packed extensive mount of autograft mix centrally and inserted the contralateral cage all under fluoroscopy.  At this point then all 4 cortical screws were placed under fluoroscopy all screws had excellent purchase I reinspected the foramina to confirm patency lay Gelfoam in the dura assembled the heads advanced the screws and anchored everything in place compressing the L4 screw against L5.  I then did place a Hemovac drain injected Exparel in the fascia and closed the wound in layers with interrupted Vicryl and a running 4 subcuticular.  Dermabond benzoin Steri-Strips and a sterile dressing was applied patient cover him in stable condition.  At the end the case all needle count sponge counts were correct.

## 2021-03-16 NOTE — Transfer of Care (Signed)
Immediate Anesthesia Transfer of Care Note  Patient: EAN GETTEL  Procedure(s) Performed: Posterior Lumbar Interbody Fusion  - Lumbar four-Lumbar five (N/A Back)  Patient Location: PACU  Anesthesia Type:General  Level of Consciousness: awake and patient cooperative  Airway & Oxygen Therapy: Patient Spontanous Breathing and Patient connected to face mask oxygen  Post-op Assessment: Report given to RN, Post -op Vital signs reviewed and stable and Patient moving all extremities X 4  Post vital signs: Reviewed and stable  Last Vitals:  Vitals Value Taken Time  BP 154/89 03/16/21 1308  Temp    Pulse 73 03/16/21 1309  Resp 14 03/16/21 1309  SpO2 100 % 03/16/21 1309  Vitals shown include unvalidated device data.  Last Pain:  Vitals:   03/16/21 0906  TempSrc:   PainSc: 0-No pain      Patients Stated Pain Goal: 3 (03/16/21 0906)  Complications: No complications documented.

## 2021-03-17 DIAGNOSIS — M5126 Other intervertebral disc displacement, lumbar region: Secondary | ICD-10-CM | POA: Diagnosis not present

## 2021-03-17 MED ORDER — CYCLOBENZAPRINE HCL 10 MG PO TABS: 10.0000 mg | ORAL_TABLET | Freq: Three times a day (TID) | ORAL | 0 refills | Status: DC | PRN

## 2021-03-17 MED ORDER — OXYCODONE HCL 10 MG PO TABS: 10.0000 mg | ORAL_TABLET | Freq: Four times a day (QID) | ORAL | 0 refills | Status: DC | PRN

## 2021-03-17 NOTE — Discharge Summary (Signed)
  Physician Discharge Summary  Patient ID: Alex Townsend MRN: [id number] DOB/AGE: [birth year] 60 y.o. Estimated body mass index is 33.53 kg/m as calculated from the following:   Height as of this encounter: 5' 10.5" (1.791 m).   Weight as of this encounter: 107.5 kg.   Admit date: 03/16/2021 Discharge date: 03/17/2021  Admission Diagnoses: Lumbar spinal stenosis into the instability and herniated nucleus pulposus L4-5  Discharge Diagnoses: Same Active Problems:   HNP (herniated nucleus pulposus), lumbar   Discharged Condition: good  Hospital Course: Patient was admitted to hospital underwent decompressive laminectomy interbody fusion L4-5.  Postoperative patient did very well recovered and floor on the floor was ambulating and voiding spontaneously tolerating regular diet and stable for discharge home.  Patient be discharged scheduled follow-up in 1 to 2 weeks.  Consults: Significant Diagnostic Studies: Treatments: PLIF L4-5 Discharge Exam: Blood pressure (!) 153/82, pulse 88, temperature 97.8 F (36.6 C), temperature source Oral, resp. rate 18, height 5' 10.5" (1.791 m), weight 107.5 kg, SpO2 98 %. Strength 5-5 on clean dry and intact  Disposition: Home   Allergies as of 03/17/2021      Reactions   Amoxicillin-pot Clavulanate Diarrhea   Other reaction(s): GI Upset (intolerance)   Ramipril Cough      Medication List    TAKE these medications   aMILoride 5 MG tablet Commonly known as: MIDAMOR Take 5 mg by mouth daily.   atorvastatin 40 MG tablet Commonly known as: LIPITOR Take 40 mg by mouth daily.   cetirizine 10 MG tablet Commonly known as: ZYRTEC Take 10 mg by mouth daily.   cholecalciferol 25 MCG (1000 UNIT) tablet Commonly known as: VITAMIN D3 Take 1,000 Units by mouth daily.   ciprofloxacin 500 MG tablet Commonly known as: CIPRO Take 1 tablet (500 mg total) by mouth 2 (two) times daily. Take if needed if you have 3-4 loose stools/ 24hr, can stop taking if  diarrhea resolves   cyclobenzaprine 10 MG tablet Commonly known as: FLEXERIL Take 1 tablet (10 mg total) by mouth 3 (three) times daily as needed for muscle spasms.   hydrALAZINE 50 MG tablet Commonly known as: APRESOLINE Take 50 mg by mouth 3 (three) times daily.   labetalol 200 MG tablet Commonly known as: NORMODYNE Take 200 mg by mouth 2 (two) times daily.   leflunomide 20 MG tablet Commonly known as: ARAVA Take 20 mg by mouth daily.   magnesium oxide 400 MG tablet Commonly known as: MAG-OX Take 400 mg by mouth daily.   NIFEdipine 90 MG 24 hr tablet Commonly known as: ADALAT CC Take 90 mg by mouth daily.   Oxycodone HCl 10 MG Tabs Take 1 tablet (10 mg total) by mouth every 6 (six) hours as needed for severe pain ((score 7 to 10)).   Rinvoq 15 MG Tb24 Generic drug: Upadacitinib ER Take 15 mg by mouth daily.   telmisartan 80 MG tablet Commonly known as: MICARDIS Take 80 mg by mouth daily.   traZODone 50 MG tablet Commonly known as: DESYREL Take 50 mg by mouth at bedtime.   VITAMIN C PO Take 1 tablet by mouth daily.        Signed: Mariam Dollar 03/17/2021, 7:31 AM

## 2021-03-17 NOTE — Discharge Instructions (Signed)
Wound Care  Keep the incision clean and dry remove the outer dressing in 2-3 days, leave the Steri-Strips intact. Do not put any creams, lotions, or ointments on incision. Leave steri-strips on back.  They will fall off by themselves.  Activity Walk each and every day, increasing distance each day. No lifting greater than 5 lbs.  No lifting no bending no twisting no driving or riding a car unless coming back and forth to see me. If provided with back brace, wear when out of bed.  It is not necessary to wear brace in bed. Diet Resume your normal diet.   Return to Work Will be discussed at you follow up appointment.  Call Your Doctor If Any of These Occur Redness, drainage, or swelling at the wound.  Temperature greater than 101 degrees. Severe pain not relieved by pain medication. Incision starts to come apart. Follow Up Appt Call today for appointment in 1-2 weeks ([phone]) or for problems.  If you have any hardware placed in your spine, you will need an x-ray before your appointment.   

## 2021-03-17 NOTE — Progress Notes (Signed)
Patient alert and oriented, voiding adequately, MAE well with no difficulty. Incision area cdi with no s/s of infection. Patient discharged home per order. Patient and sister stated understanding of discharge instructions given. Patient has an appointment with Dr. Venetia Maxon in 2 weeks.

## 2021-03-17 NOTE — Plan of Care (Signed)
Adequately ready for discharge 

## 2021-03-17 NOTE — Evaluation (Signed)
Physical Therapy Evaluation and Discharge Patient Details Name: Alex Townsend MRN: [id number] DOB: 04-17-1961 Today's Date: 03/17/2021   History of Present Illness  Pt is a 60 y/o male who presents s/p L4-L5 PLIF on 03/16/2021. PMH significant for HTN, RA.  Clinical Impression  Patient evaluated by Physical Therapy with no further acute PT needs identified. All education has been completed and the patient has no further questions. Pt was able to demonstrate transfers and ambulation with gross modified independence by end of session with [facility] for support. Pt was educated on precautions, brace application/wearing schedule, appropriate activity progression, and car transfer. We also reviewed ADL training and adaptive equipment recommendations. See below for any follow-up Physical Therapy or equipment needs. PT is signing off. Thank you for this referral.     Follow Up Recommendations No PT follow up;Supervision for mobility/OOB    Equipment Recommendations  Cane;3in1 (PT)    Recommendations for Other Services       Precautions / Restrictions Precautions Precautions: Fall;Back Precaution Booklet Issued: Yes (comment) Precaution Comments: Reviewed handout and pt was cued on precautions during functional mobility. Required Braces or Orthoses: Spinal Brace Spinal Brace: Lumbar corset;Applied in sitting position Restrictions Weight Bearing Restrictions: No      Mobility  Bed Mobility Overal bed mobility: Needs Assistance Bed Mobility: Sidelying to Sit;Rolling;Sit to Sidelying Rolling: Modified independent (Device/Increase time) Sidelying to sit: Modified independent (Device/Increase time)     Sit to sidelying: Supervision General bed mobility comments: VC's for optimal log roll technique.    Transfers Overall transfer level: Modified independent Equipment used: Rolling walker (2 wheeled);None;Straight cane;4-wheeled walker Transfers: Sit to/from Stand           General  transfer comment: Initially with RW and then with SPC. By end of session pt managing transfers without AD.  Ambulation/Gait Ambulation/Gait assistance: Supervision Gait Distance (Feet): 400 Feet Assistive device: Rolling walker (2 wheeled);Straight cane;None Gait Pattern/deviations: Step-through pattern;Decreased stride length;Trunk flexed Gait velocity: Decreased Gait velocity interpretation: 1.31 - 2.62 ft/sec, indicative of limited community ambulator General Gait Details: VC's for improved posture, closer walker proximity and forward gaze. Pt able to progress to [facility] where posture and general gait pattern appeared improved. By end of session pt managing steps around the hospital room without AD.  Stairs            Wheelchair Mobility    Modified Rankin (Stroke Patients Only)       Balance Overall balance assessment: Needs assistance Sitting-balance support: No upper extremity supported;Feet supported Sitting balance-Leahy Scale: Fair     Standing balance support: No upper extremity supported;During functional activity Standing balance-Leahy Scale: Fair Standing balance comment: Slow and guarded but able to manage without UE support                             Pertinent Vitals/Pain Pain Assessment: Faces Faces Pain Scale: Hurts little more Pain Location: back Pain Descriptors / Indicators: Operative site guarding Pain Intervention(s): Limited activity within patient's tolerance;Monitored during session;Repositioned    Home Living Family/patient expects to be discharged to:: Private residence Living Arrangements: Spouse/significant other Available Help at Discharge: Family;Available 24 hours/day (Initially and then will be alone at times) Type of Home: House Home Access: Stairs to enter   Entrance Stairs-Number of Steps: 1 Home Layout: Two level;Able to live on main level with bedroom/bathroom Home Equipment: None      Prior Function Level of  Independence: Independent  Comments: Works for the [employer]. Occasionally has to lift up to 70 lbs which is how he hurt his back     Hand Dominance        Extremity/Trunk Assessment   Upper Extremity Assessment Upper Extremity Assessment: Defer to OT evaluation    Lower Extremity Assessment Lower Extremity Assessment: Generalized weakness (Pt reports B knee problems due to RA)    Cervical / Trunk Assessment Cervical / Trunk Assessment: Other exceptions Cervical / Trunk Exceptions: s/p surgery  Communication   Communication: No difficulties  Cognition Arousal/Alertness: Awake/alert Behavior During Therapy: WFL for tasks assessed/performed Overall Cognitive Status: Within Functional Limits for tasks assessed                                        General Comments General comments (skin integrity, edema, etc.): Reviewed adaptive equipment recommendations and ADL training as there was no OT orders for this patient.    Exercises     Assessment/Plan    PT Assessment Patent does not need any further PT services  PT Problem List         PT Treatment Interventions      PT Goals (Current goals can be found in the Care Plan section)  Acute Rehab PT Goals Patient Stated Goal: Home today PT Goal Formulation: All assessment and education complete, DC therapy    Frequency     Barriers to discharge        Co-evaluation               AM-PAC PT "6 Clicks" Mobility  Outcome Measure Help needed turning from your back to your side while in a flat bed without using bedrails?: None Help needed moving from lying on your back to sitting on the side of a flat bed without using bedrails?: None Help needed moving to and from a bed to a chair (including a wheelchair)?: None Help needed standing up from a chair using your arms (e.g., wheelchair or bedside chair)?: None Help needed to walk in hospital room?: None Help needed climbing 3-5 steps with a  railing? : A Little 6 Click Score: 23    End of Session Equipment Utilized During Treatment: Gait belt;Back brace Activity Tolerance: Patient tolerated treatment well Patient left: in bed;with call bell/phone within reach Nurse Communication: Mobility status PT Visit Diagnosis: Unsteadiness on feet (R26.81);Pain Pain - part of body:  (back)    Time: 6606-3016 PT Time Calculation (min) (ACUTE ONLY): 39 min   Charges:   PT Evaluation $PT Eval Low Complexity: 1 Low PT Treatments $Gait Training: 8-22 mins $Self Care/Home Management: 8-22        Alex Townsend, PT, DPT Acute Rehabilitation Services Pager: [phone] Office: [phone]   Marylynn Pearson 03/17/2021, 12:17 PM

## 2022-06-11 IMAGING — RF DG LUMBAR SPINE 2-3V
1 series · 2 of 2 positions shown · non-contrast
Comparison: Lumbar spine MRI 02/03/2021.

CLINICAL DATA: Provided history: Surgery, elective. PLIF L4-5.
Provided fluoroscopy time 45 seconds (34.88 mGy).

EXAM:
LUMBAR SPINE - 2-3 VIEW; DG C-ARM 1-60 MIN

[Series 1: run · 2 of 2 slices shown]
[im 1/2]
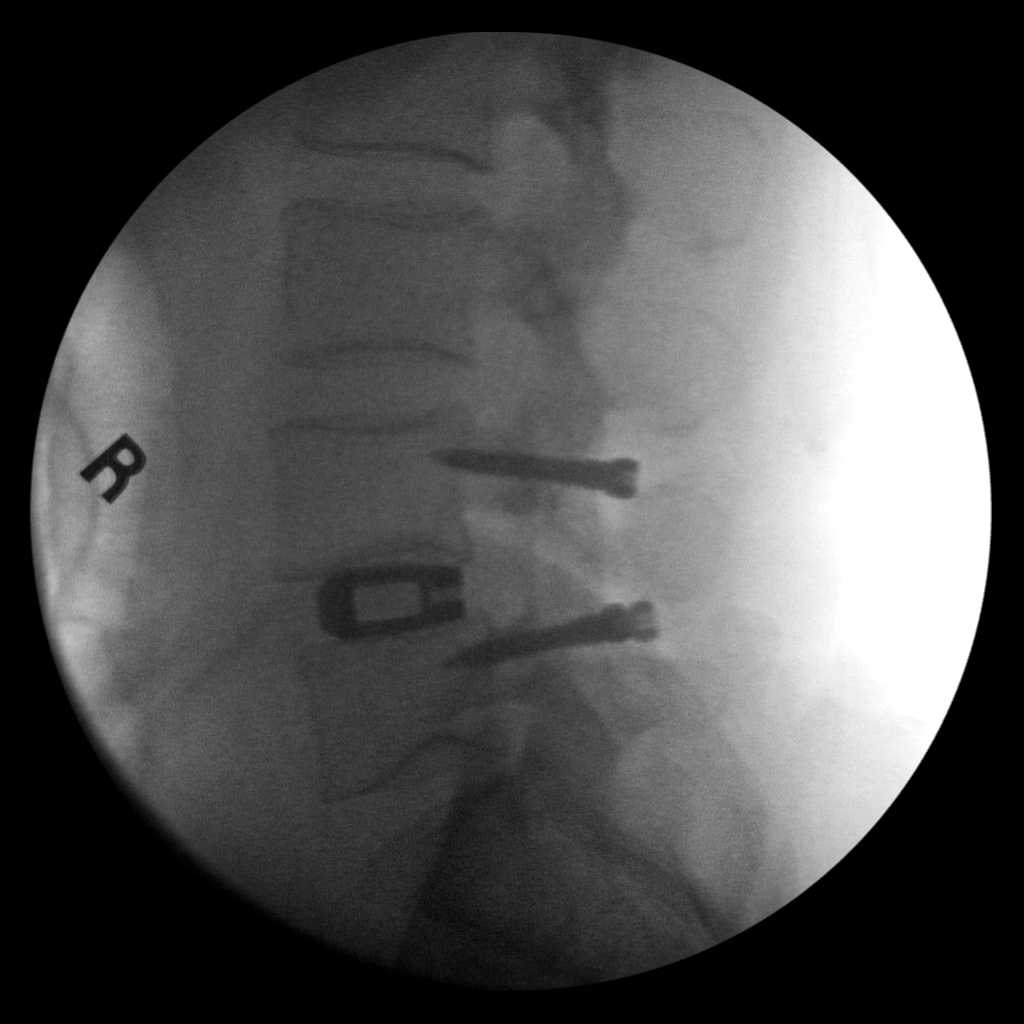
[im 2/2]
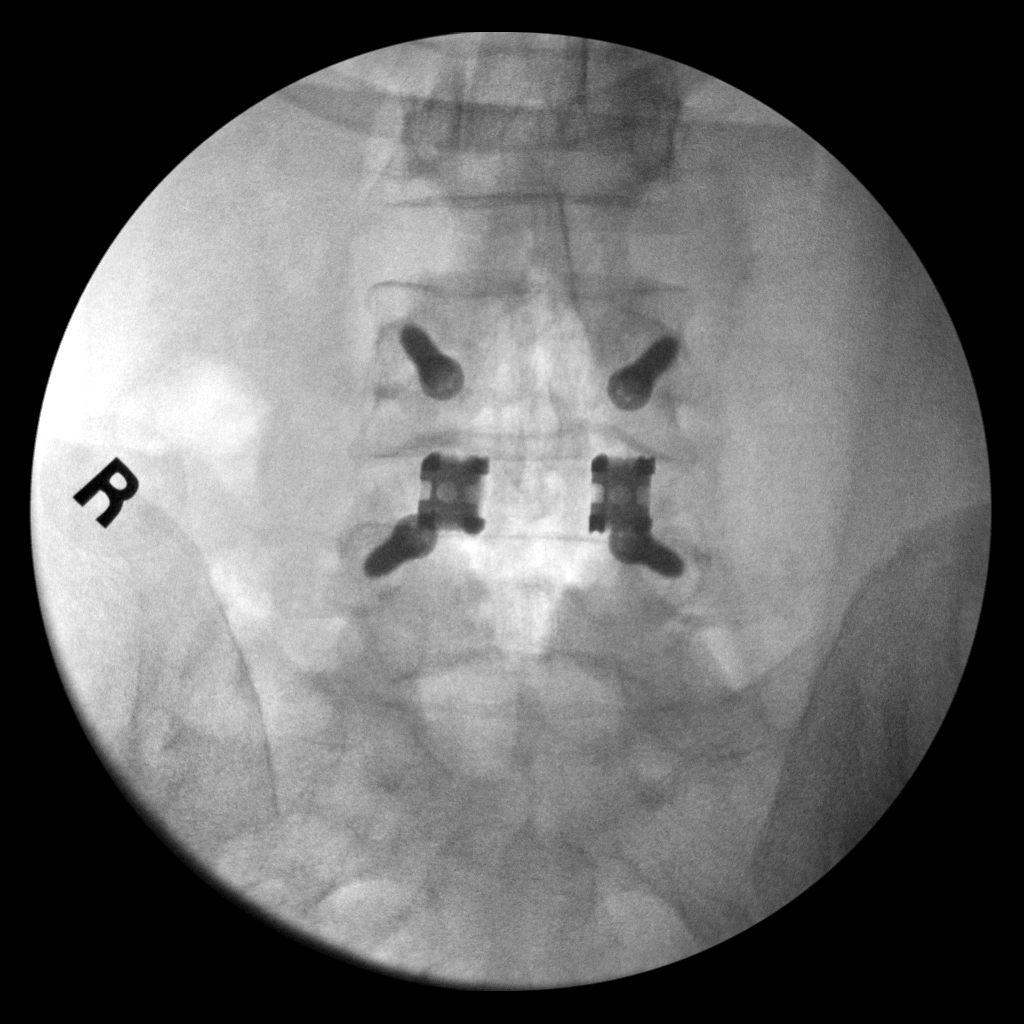

[2 of 2 positions shown; findings below may reference images not displayed]

FINDINGS: AP and lateral view intraoperative fluoroscopic images of the lumbar
spine are submitted, 2 images total. The lowest well-formed
intervertebral disc space is designated L5-S1. On the provided
images, bilateral pedicle screws are present at L4 and L5. Vertical
interconnecting rods were not present at the time the images were
taken. Interbody device(s) also present at L4-L5.
IMPRESSION: Two intraoperative fluoroscopic images from L4-L5 fusion, as
described.

## 2023-09-05 ENCOUNTER — Other Ambulatory Visit: Payer: Self-pay | Admitting: Internal Medicine

## 2023-09-05 DIAGNOSIS — M79672 Pain in left foot: Secondary | ICD-10-CM

## 2023-09-05 DIAGNOSIS — M84375A Stress fracture, left foot, initial encounter for fracture: Secondary | ICD-10-CM

## 2023-09-05 DIAGNOSIS — M0589 Other rheumatoid arthritis with rheumatoid factor of multiple sites: Secondary | ICD-10-CM

## 2023-09-29 ENCOUNTER — Ambulatory Visit
Admission: RE | Admit: 2023-09-29 | Discharge: 2023-09-29 | Disposition: A | Discharge status: Home / Self Care | Payer: No Typology Code available for payment source | Source: Ambulatory Visit | Attending: Internal Medicine | Admitting: Internal Medicine

## 2023-09-29 DIAGNOSIS — M0589 Other rheumatoid arthritis with rheumatoid factor of multiple sites: Secondary | ICD-10-CM

## 2023-09-29 DIAGNOSIS — M79672 Pain in left foot: Secondary | ICD-10-CM

## 2023-09-29 DIAGNOSIS — M84375A Stress fracture, left foot, initial encounter for fracture: Secondary | ICD-10-CM

## 2024-03-07 ENCOUNTER — Telehealth: Payer: Self-pay | Admitting: Family Medicine

## 2024-03-07 ENCOUNTER — Other Ambulatory Visit: Payer: Self-pay

## 2024-03-07 NOTE — Telephone Encounter (Signed)
 Medication not on medication list. Please advise.    Copied from CRM [id number]. Topic: Clinical - Medication Refill >> Mar 07, 2024 11:43 AM Felizardo Hotter wrote: Most Recent Primary Care Visit:   Medication: Jublia  Has the patient contacted their pharmacy? Yes (Agent: If no, request that the patient contact the pharmacy for the refill. If patient does not wish to contact the pharmacy document the reason why and proceed with request.) (Agent: If yes, when and what did the pharmacy advise?)  Is this the correct pharmacy for this prescription? Yes If no, delete pharmacy and type the correct one.  This is the patient's preferred pharmacy:    [facility] - [address] [place], [place] [address] [place], [place] [address] ph: [phone]  Has the prescription been filled recently? Yes  Is the patient out of the medication? Yes  Has the patient been seen for an appointment in the last year OR does the patient have an upcoming appointment? Yes  Can we respond through MyChart? Yes  Agent: Please be advised that Rx refills may take up to 3 business days. We ask that you follow-up with your pharmacy.

## 2024-03-09 ENCOUNTER — Other Ambulatory Visit: Payer: Self-pay

## 2024-03-09 MED ORDER — JUBLIA 10 % EX SOLN: CUTANEOUS | 0 refills | Status: DC

## 2024-04-17 ENCOUNTER — Other Ambulatory Visit: Payer: Self-pay | Admitting: Family Medicine

## 2024-04-17 NOTE — Telephone Encounter (Unsigned)
 Copied from CRM [id number]. Topic: Clinical - Medication Refill >> Apr 17, 2024  9:36 AM Alex Townsend wrote: Medication: traZODone  (DESYREL ) 50 MG tablet - takes two per day per patient - not sure exact dose   Has the patient contacted their pharmacy? Yes (Agent: If no, request that the patient contact the pharmacy for the refill. If patient does not wish to contact the pharmacy document the reason why and proceed with request.) (Agent: If yes, when and what did the pharmacy advise?) need provider approval  This is the patient's preferred pharmacy:  [id number] - [place], [place] - [address] [place] [address] Phone: [phone] Fax: [phone]  Is this the correct pharmacy for this prescription? Yes If no, delete pharmacy and type the correct one.   Has the prescription been filled recently? No  Is the patient out of the medication? N/A  Has the patient been seen for an appointment in the last year OR does the patient have an upcoming appointment? Yes - 05/29/2024  Can we respond through MyChart? No  Agent: Please be advised that Rx refills may take up to 3 business days. We ask that you follow-up with your pharmacy.

## 2024-04-18 MED ORDER — TRAZODONE HCL 50 MG PO TABS: 50.0000 mg | ORAL_TABLET | Freq: Every day | ORAL | 0 refills | Status: DC

## 2024-04-18 NOTE — Telephone Encounter (Signed)
 Requested medication (s) are due for refill today -expired Rx  Requested medication (s) are on the active medication list -yes  Future visit scheduled -yes  Last refill: 03/09/21  Notes to clinic: listed as historical provider   Requested Prescriptions  Pending Prescriptions Disp Refills   traZODone  (DESYREL ) 50 MG tablet      Sig: Take 1 tablet (50 mg total) by mouth at bedtime.     Psychiatry: Antidepressants - Serotonin Modulator Failed - 04/18/2024 12:19 PM      Failed - Valid encounter within last 6 months    Recent Outpatient Visits   None               Requested Prescriptions  Pending Prescriptions Disp Refills   traZODone  (DESYREL ) 50 MG tablet      Sig: Take 1 tablet (50 mg total) by mouth at bedtime.     Psychiatry: Antidepressants - Serotonin Modulator Failed - 04/18/2024 12:19 PM      Failed - Valid encounter within last 6 months    Recent Outpatient Visits   None

## 2024-04-23 NOTE — Telephone Encounter (Signed)
 Medication approved and sent to pharmacy on file by provider.

## 2024-05-29 ENCOUNTER — Ambulatory Visit: Admitting: Family Medicine

## 2024-06-18 ENCOUNTER — Ambulatory Visit: Admitting: Family Medicine

## 2024-06-18 ENCOUNTER — Encounter: Payer: Self-pay | Admitting: Family Medicine

## 2024-06-18 VITALS — BP 134/86 | HR 69 | Temp 98.1°F | Ht 70.5 in | Wt 231.5 lb

## 2024-06-18 DIAGNOSIS — M5126 Other intervertebral disc displacement, lumbar region: Secondary | ICD-10-CM | POA: Insufficient documentation

## 2024-06-18 DIAGNOSIS — E7849 Other hyperlipidemia: Secondary | ICD-10-CM | POA: Diagnosis not present

## 2024-06-18 DIAGNOSIS — M170 Bilateral primary osteoarthritis of knee: Secondary | ICD-10-CM | POA: Diagnosis not present

## 2024-06-18 DIAGNOSIS — Z23 Encounter for immunization: Secondary | ICD-10-CM | POA: Diagnosis not present

## 2024-06-18 DIAGNOSIS — Z800 Family history of malignant neoplasm of digestive organs: Secondary | ICD-10-CM

## 2024-06-18 DIAGNOSIS — I10 Essential (primary) hypertension: Secondary | ICD-10-CM | POA: Diagnosis not present

## 2024-06-18 DIAGNOSIS — M5440 Lumbago with sciatica, unspecified side: Secondary | ICD-10-CM | POA: Insufficient documentation

## 2024-06-18 DIAGNOSIS — R0982 Postnasal drip: Secondary | ICD-10-CM

## 2024-06-18 DIAGNOSIS — Z79899 Other long term (current) drug therapy: Secondary | ICD-10-CM | POA: Diagnosis not present

## 2024-06-18 DIAGNOSIS — N289 Disorder of kidney and ureter, unspecified: Secondary | ICD-10-CM

## 2024-06-18 DIAGNOSIS — G4700 Insomnia, unspecified: Secondary | ICD-10-CM

## 2024-06-18 DIAGNOSIS — Z7689 Persons encountering health services in other specified circumstances: Secondary | ICD-10-CM

## 2024-06-18 MED ORDER — ATORVASTATIN CALCIUM 40 MG PO TABS: 40.0000 mg | ORAL_TABLET | Freq: Every day | ORAL | 1 refills | Status: DC

## 2024-06-18 MED ORDER — TRAZODONE HCL 100 MG PO TABS: 100.0000 mg | ORAL_TABLET | Freq: Every day | ORAL | 1 refills | Status: DC

## 2024-06-18 NOTE — Progress Notes (Signed)
 Patient Office Visit  Assessment & Plan:  Hypertension, unspecified type -     CBC with Differential/Platelet -     Comprehensive metabolic panel with GFR  Other hyperlipidemia -     Atorvastatin  Calcium ; Take 1 tablet (40 mg total) by mouth daily.  Dispense: 90 tablet; Refill: 1 -     Lipid panel  Encounter to establish care -     VITAMIN D  25 Hydroxy (Vit-D Deficiency, Fractures) -     TSH  Osteoarthritis of both knees, unspecified osteoarthritis type  Taking a statin medication  Insomnia, unspecified type -     traZODone  HCl; Take 1 tablet (100 mg total) by mouth at bedtime.  Dispense: 90 tablet; Refill: 1  Renal lesion  Family history of colon cancer in father -     Ambulatory referral to Gastroenterology  Need for pneumococcal 20-valent conjugate vaccination -     Pneumococcal conjugate vaccine 20-valent  Postnasal drip   Assessment and Plan    Osteoarthritis of both knees and rheumatoid arthritis Chronic osteoarthritis with bone-on-bone changes causing pain and limiting exercise. Rheumatoid arthritis exacerbating joint issues. Receiving Synvisc injections to delay knee replacement. Orthopedic consultation ongoing. - Continue Synvisc injections in both knees. - Consult with orthopedic specialist, [name], for ongoing management.  Hypertension with edema Hypertension with fluctuating blood pressure and edema. Increased amiloride  dosage to manage fluid retention. On multiple antihypertensives. - Continue current antihypertensive regimen.  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 Indeterminate right kidney lesion identified in 2019, under surveillance. No recent imaging.  - Send report of right kidney lesion to nephrologist, [name], for further evaluation.  Iron deficiency anemia with possible hereditary component and chronic microcytic anemia Chronic  microcytic anemia with possible hereditary component. Previous hematology evaluation inconclusive. Taking daily iron supplements. Reports low energy. - Continue daily iron supplementation.  Insomnia Chronic insomnia managed with trazodone . Dose increased to 100 mg nightly to improve sleep. - Increase trazodone  to 100 mg nightly.  Allergic rhinitis with postnasal drip Chronic allergic rhinitis with postnasal drip and congestion. Symptoms suggestive of allergies. - Use Flonase or Nasonex nasal spray daily. - Take an oral antihistamine such as Claritin  or Zyrtec daily.  Hyperlipidemia Chronic hyperlipidemia managed with medication. - Continue current cholesterol medication.  Obesity Obesity with previous successful weight loss. Current management includes dietary modifications. - Encourage continued dietary modifications.     Test results were reviewed and analyzed as part of the medical decision making of this visit.  [facility] and [facility] during the office visit.  Recommend healthy diet i.e mediterranean/DASH diet, consistent exercise - 30 minutes 5 day per week, and gradual ongoing weight loss. Will send copy of CT scan report to Nephrologist in [facility].  GI consult in [facility] with Dr. [name]   Return if symptoms worsen or fail to improve, for physical.   Subjective:    Patient ID: Alex Townsend, male    DOB: 18-Sep-1961  Age: 63 y.o. MRN: [id number]  No chief complaint on file.   HPI Discussed the use of AI scribe software for clinical note transcription with the patient, who gave verbal consent to proceed.  History of Present Illness        Alex Townsend is a 63 year old male with rheumatoid arthritis and osteoarthritis who presents with  ongoing bilateral knee pain with difficulty exercising, follow up hypertension, hyperlipidemia  and establish care here.   He  experiences significant knee pain due to both rheumatoid arthritis and osteoarthritis, which has made exercising difficult. He is currently receiving Synvisc injections in both knees to manage the pain.  He also has arthritis in his feet, which are described as 'crooked up', further limiting his ability to exercise. He walks very little due to pain and has not found a suitable location for water-based exercises like swimming.  He has a history of a fifth metatarsal fracture, which healed in about six weeks. His toes are crooked, but he does not report any current issues with the fracture site.  He is currently taking multiple medications for blood pressure management, including amiloride , which was recently increased from 5 mg to 10 mg due to fluid retention. He also takes hydralazine , Micardis (telmisartan), nifedipine , and labetalol . He reports stable kidney function but fluctuating blood pressure.  He has a history of anemia, which he describes as hereditary, and has been taking iron supplements daily. He reports low energy levels and is seeking further evaluation for his anemia.  He underwent back surgery, which resolved his back issues. He takes trazodone  for insomnia and reports no issues with morning grogginess.  A past CT scan in 2019 for abdominal pain revealed an indeterminate lesion on the right kidney. He did not follow up with an MRI. We will send report to his nephrologist [name] in [place]  He has a family history of colon and prostate cancer, with his father having had both. He is unsure of his last colonoscopy date but believes it was around 2018.  He reports sinus issues with a lot of drainage and occasional epistaxis. He has not been using his prescribed nasal spray regularly. Physical Exam HEENT: Swollen nasal mucosa. Results LABS HbA1c: 5.9 (01/2024)  RADIOLOGY CT scan abdomen: Indeterminate lesion extending from the posterior mid aspect of the right kidney  (2019) Assessment & Plan Osteoarthritis of both knees and rheumatoid arthritis Chronic osteoarthritis with bone-on-bone changes causing pain and limiting exercise. Rheumatoid arthritis exacerbating joint issues. Receiving Synvisc injections to delay knee replacement. Orthopedic consultation ongoing. - Continue Synvisc injections in both knees. - Consult with orthopedic specialist, [name], for ongoing management.  Hypertension with edema Hypertension with fluctuating blood pressure and edema. Increased amiloride  dosage to manage fluid retention. On multiple antihypertensives. Followed by Nephrology re this.  - Continue current antihypertensive regimen.  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 Indeterminate right kidney lesion identified in 2019, under surveillance. No recent imaging.  - Send report of right kidney lesion to nephrologist, [name], for further evaluation.  Iron deficiency anemia with possible hereditary component and chronic microcytic anemia- was previously followed by Hematology Dr. Madison in [facility] Chronic microcytic anemia with possible hereditary component. Previous hematology evaluation inconclusive. Taking daily iron supplements. Reports low energy. - Continue daily iron supplementation.  Insomnia Chronic insomnia managed with trazodone . Dose increased to 100 mg nightly to improve sleep. - Increase trazodone  to 100 mg nightly.  Allergic rhinitis with postnasal drip Chronic allergic rhinitis with postnasal drip and congestion. Symptoms suggestive of allergies. - Use Flonase or Nasonex nasal spray daily. - Take an oral antihistamine such as Claritin  or Zyrtec daily.  Hyperlipidemia Chronic hyperlipidemia managed with medication. - Continue current cholesterol medication. Recommend healthy diet i.e mediterranean/DASH diet, consistent exercise - 30 minutes 5 day  per week, and gradual weight loss.   Obesity Obesity with previous successful weight loss. Current management includes dietary modifications. - Encourage continued dietary modifications.    The 10-year ASCVD risk score (Arnett DK, et al., 2019) is: 15.6%  Past Medical History:  Diagnosis Date   Acid reflux    Anemia    Arthritis    rheumatoid arthritis   History of kidney stones    Hyperlipidemia    Hypertension    Past Surgical History:  Procedure Laterality Date   ANKLE SURGERY Right    CRIF of dislocated R ankle   CYSTOSCOPY W/ STONE MANIPULATION     CYSTOSCOPY WITH HOLMIUM LASER LITHOTRIPSY     POLYPECTOMY     WISDOM TOOTH EXTRACTION     Social History   Tobacco Use   Smoking status: Never   Smokeless tobacco: Never  Vaping Use   Vaping status: Never Used  Substance Use Topics   Alcohol use: Not Currently   Drug use: Not Currently   Family History  Problem Relation Age of Onset   Breast cancer Mother    Ovarian cancer Mother    Arthritis Father    Dementia Father    GI problems Father    Prostate cancer Father    Hypertension Father    Colon cancer Father    Breast cancer Sister    Dementia Sister    Heart attack Brother    Pulmonary embolism Brother    Allergies  Allergen Reactions   Amoxicillin-Pot Clavulanate Diarrhea    Other reaction(s): GI Upset (intolerance)    Ramipril Cough    ROS    Objective:    BP 134/86   Pulse 69   Temp 98.1 F (36.7 C)   Ht 5' 10.5 (1.791 m)   Wt 231 lb 8 oz (105 kg)   SpO2 98%   BMI 32.75 kg/m  BP Readings from Last 3 Encounters:  06/18/24 134/86  03/17/21 (!) 145/83  03/12/21 (!) 155/92   Wt Readings from Last 3 Encounters:  06/18/24 231 lb 8 oz (105 kg)  03/16/21 237 lb (107.5 kg)  03/12/21 237 lb (107.5 kg)    Physical Exam Vitals and nursing note reviewed.  Constitutional:      General: He is not in acute distress.    Appearance: Normal appearance.  HENT:     Head: Normocephalic.      Right Ear: Tympanic membrane, ear canal and external ear normal.     Left Ear: Tympanic membrane, ear canal and external ear normal.  Eyes:     Extraocular Movements: Extraocular movements intact.     Pupils: Pupils are equal, round, and reactive to light.  Cardiovascular:     Rate and Rhythm: Normal rate and regular rhythm.     Heart sounds: Normal heart sounds.  Pulmonary:     Effort: Pulmonary effort is normal.     Breath sounds: Normal breath sounds.  Musculoskeletal:     Right lower leg: No edema.     Left lower leg: No edema.  Neurological:     General: No focal deficit present.     Mental Status: He is alert and oriented to person, place, and time.  Psychiatric:        Mood and Affect: Mood normal.        Behavior: Behavior normal.        Thought Content: Thought content normal.        Judgment: Judgment normal.  No results found for any visits on 06/18/24.

## 2024-06-19 ENCOUNTER — Ambulatory Visit: Payer: Self-pay | Admitting: Family Medicine

## 2024-06-19 LAB — COMPREHENSIVE METABOLIC PANEL WITH GFR
AG Ratio: 1.6 (calc) (ref 1.0–2.5)
ALT: 13 U/L (ref 9–46)
AST: 19 U/L (ref 10–35)
Albumin: 4.1 g/dL (ref 3.6–5.1)
Alkaline phosphatase (APISO): 75 U/L (ref 35–144)
BUN: 19 mg/dL (ref 7–25)
CO2: 29 mmol/L (ref 20–32)
Calcium: 9.2 mg/dL (ref 8.6–10.3)
Chloride: 102 mmol/L (ref 98–110)
Creat: 1.14 mg/dL (ref 0.70–1.35)
Globulin: 2.5 g/dL (ref 1.9–3.7)
Glucose, Bld: 91 mg/dL (ref 65–99)
Potassium: 3.8 mmol/L (ref 3.5–5.3)
Sodium: 139 mmol/L (ref 135–146)
Total Bilirubin: 0.4 mg/dL (ref 0.2–1.2)
Total Protein: 6.6 g/dL (ref 6.1–8.1)
eGFR: 72 mL/min/1.73m2 (ref >=60)

## 2024-06-19 LAB — CBC WITH DIFFERENTIAL/PLATELET
Absolute Lymphocytes: 1446 {cells}/uL (ref 850–3900)
Absolute Monocytes: 655 {cells}/uL (ref 200–950)
Basophils Absolute: 42 {cells}/uL (ref 0–200)
Basophils Relative: 0.8 %
Eosinophils Absolute: 99 {cells}/uL (ref 15–500)
Eosinophils Relative: 1.9 %
HCT: 41.7 % (ref 38.5–50.0)
Hemoglobin: 12.5 g/dL — ABNORMAL LOW (ref 13.2–17.1)
MCH: 23 pg — ABNORMAL LOW (ref 27.0–33.0)
MCHC: 30 g/dL — ABNORMAL LOW (ref 32.0–36.0)
MCV: 76.7 fL — ABNORMAL LOW (ref 80.0–100.0)
MPV: 10.3 fL (ref 7.5–12.5)
Monocytes Relative: 12.6 %
Neutro Abs: 2959 {cells}/uL (ref 1500–7800)
Neutrophils Relative %: 56.9 %
Platelets: 289 Thousand/uL (ref 140–400)
RBC: 5.44 Million/uL (ref 4.20–5.80)
RDW: 14.1 % (ref 11.0–15.0)
Total Lymphocyte: 27.8 %
WBC: 5.2 Thousand/uL (ref 3.8–10.8)

## 2024-06-19 LAB — LIPID PANEL
Cholesterol: 207 mg/dL — ABNORMAL HIGH (ref <200)
HDL: 43 mg/dL (ref >=40)
LDL Cholesterol (Calc): 124 mg/dL — ABNORMAL HIGH
Non-HDL Cholesterol (Calc): 164 mg/dL — ABNORMAL HIGH (ref <130)
Total CHOL/HDL Ratio: 4.8 (calc) (ref <5.0)
Triglycerides: 245 mg/dL — ABNORMAL HIGH (ref <150)

## 2024-06-19 LAB — TSH: TSH: 0.76 m[IU]/L (ref 0.40–4.50)

## 2024-06-19 LAB — VITAMIN D 25 HYDROXY (VIT D DEFICIENCY, FRACTURES): Vit D, 25-Hydroxy: 52 ng/mL (ref 30–100)

## 2024-06-23 ENCOUNTER — Other Ambulatory Visit: Payer: Self-pay | Admitting: Family Medicine

## 2024-07-02 ENCOUNTER — Telehealth: Payer: Self-pay

## 2024-07-02 ENCOUNTER — Other Ambulatory Visit: Payer: Self-pay

## 2024-07-02 DIAGNOSIS — B351 Tinea unguium: Secondary | ICD-10-CM

## 2024-07-02 MED ORDER — JUBLIA 10 % EX SOLN: CUTANEOUS | 0 refills | Status: AC

## 2024-07-02 NOTE — Telephone Encounter (Signed)
 Copied from CRM #[id number]. Topic: Clinical - Prescription Issue >> Jul 02, 2024 10:37 AM Berwyn MATSU wrote: Reason for CRM: Patient called in to advise that the [facility] pharmacy he uses can not depense the 4ml of the medication Efinaconazole  (JUBLIA ) 10 % SOLN Medication Date: 06/25/2024 Department: [facility] Ordering/Authorizing: Aletha Bene, MD  Order Providers  Prescribing Provider Encounter Provider Aletha Bene, MD Aletha Bene, MD  Outpatient Medication Detail   Disp Refills Start End  Efinaconazole  (JUBLIA ) 10 % SOLN 4 mL      He is requesting 8ml so he can obtain medication.   May you please assist.

## 2024-07-03 LAB — LAB REPORT - SCANNED: EGFR: 52

## 2024-07-04 ENCOUNTER — Encounter: Payer: Self-pay | Admitting: Internal Medicine

## 2024-09-24 ENCOUNTER — Ambulatory Visit: Admitting: Family Medicine

## 2024-10-02 DIAGNOSIS — M25462 Effusion, left knee: Secondary | ICD-10-CM | POA: Insufficient documentation

## 2024-10-02 DIAGNOSIS — M1712 Unilateral primary osteoarthritis, left knee: Secondary | ICD-10-CM | POA: Insufficient documentation

## 2024-12-02 DIAGNOSIS — M170 Bilateral primary osteoarthritis of knee: Secondary | ICD-10-CM | POA: Insufficient documentation

## 2024-12-08 ENCOUNTER — Other Ambulatory Visit: Payer: Self-pay | Admitting: Family Medicine

## 2024-12-08 DIAGNOSIS — G4700 Insomnia, unspecified: Secondary | ICD-10-CM

## 2024-12-10 NOTE — Telephone Encounter (Signed)
 Requested Prescriptions  Pending Prescriptions Disp Refills   traZODone  (DESYREL ) 100 MG tablet [Pharmacy Med Name: TRAZODONE  100 MG TABLET] 90 tablet 0    Sig: TAKE 1 TABLET BY MOUTH EVERYDAY AT BEDTIME     Psychiatry: Antidepressants - Serotonin Modulator Passed - 12/10/2024 10:46 AM      Passed - Valid encounter within last 6 months    Recent Outpatient Visits           5 months ago Hypertension, unspecified type    [facility] Aletha Bene, MD

## 2024-12-15 ENCOUNTER — Other Ambulatory Visit: Payer: Self-pay | Admitting: Family Medicine

## 2024-12-15 DIAGNOSIS — E7849 Other hyperlipidemia: Secondary | ICD-10-CM

## 2024-12-19 ENCOUNTER — Encounter: Payer: Self-pay | Admitting: Family Medicine

## 2024-12-19 ENCOUNTER — Ambulatory Visit: Admitting: Family Medicine

## 2024-12-19 VITALS — BP 138/86 | HR 71 | Ht 70.5 in | Wt 230.4 lb

## 2024-12-19 DIAGNOSIS — Z0001 Encounter for general adult medical examination with abnormal findings: Secondary | ICD-10-CM

## 2024-12-19 DIAGNOSIS — Z1211 Encounter for screening for malignant neoplasm of colon: Secondary | ICD-10-CM

## 2024-12-19 DIAGNOSIS — Z6832 Body mass index (BMI) 32.0-32.9, adult: Secondary | ICD-10-CM

## 2024-12-19 DIAGNOSIS — M0579 Rheumatoid arthritis with rheumatoid factor of multiple sites without organ or systems involvement: Secondary | ICD-10-CM

## 2024-12-19 DIAGNOSIS — G8929 Other chronic pain: Secondary | ICD-10-CM

## 2024-12-19 DIAGNOSIS — M25562 Pain in left knee: Secondary | ICD-10-CM

## 2024-12-19 DIAGNOSIS — H547 Unspecified visual loss: Secondary | ICD-10-CM

## 2024-12-19 DIAGNOSIS — Z0000 Encounter for general adult medical examination without abnormal findings: Secondary | ICD-10-CM

## 2024-12-19 DIAGNOSIS — E559 Vitamin D deficiency, unspecified: Secondary | ICD-10-CM

## 2024-12-19 DIAGNOSIS — I10 Essential (primary) hypertension: Secondary | ICD-10-CM

## 2024-12-19 DIAGNOSIS — E785 Hyperlipidemia, unspecified: Secondary | ICD-10-CM

## 2024-12-19 DIAGNOSIS — M25561 Pain in right knee: Secondary | ICD-10-CM

## 2024-12-19 MED ORDER — LABETALOL HCL 200 MG PO TABS: 200.0000 mg | ORAL_TABLET | Freq: Two times a day (BID) | ORAL | 1 refills | Status: AC

## 2024-12-19 NOTE — Progress Notes (Signed)
 "  Complete physical exam  Assessment & Plan:    Routine Health Maintenance and Physical Exam Discussed health benefits of physical activity, and encouraged him to engage in regular exercise appropriate for his age and condition.  Preventative health care -     CBC with Differential/Platelet -     Comprehensive metabolic panel with GFR -     Hemoglobin A1c -     VITAMIN D  25 Hydroxy (Vit-D Deficiency, Fractures)  Hypertension, unspecified type -     Labetalol  HCl; Take 1 tablet (200 mg total) by mouth 2 (two) times daily.  Dispense: 60 tablet; Refill: 1 -     CBC with Differential/Platelet -     Comprehensive metabolic panel with GFR -     Magnesium   Hyperlipidemia, unspecified hyperlipidemia type -     Lipid panel  Vision decreased -     Hemoglobin A1c -     VITAMIN D  25 Hydroxy (Vit-D Deficiency, Fractures) -     PSA  Chronic pain of both knees  BMI 32.0-32.9,adult -     Hemoglobin A1c -     TSH  Screening for colorectal cancer -     Ambulatory referral to Gastroenterology  Vitamin D  deficiency -     VITAMIN D  25 Hydroxy (Vit-D Deficiency, Fractures)  Rheumatoid arthritis involving multiple sites with positive rheumatoid factor (HCC) -     VITAMIN D  25 Hydroxy (Vit-D Deficiency, Fractures)    Return in about 6 months (around 06/18/2025), or if symptoms worsen or fail to improve. Recommend healthy diet i.e mediterranean/DASH diet, consistent exercise - 30 minutes 5 day per week, and gradual weight loss. Test results were reviewed and analyzed as part of the medical decision making of this visit.  Reviewed previous notes during the office visit and previous labs.  GI Consult ordered with [facility] for colon cancer screening.       Subjective:  Patient ID: Alex Townsend, male    DOB: [birth year]  Age: 64 y.o. MRN: [id number] Chief Complaint  Patient presents with   Annual Exam    Pt states he had his flu shot in October 2025.    Alex Townsend is a 64 y.o.  male who presents today for a complete physical exam. Colonoscopy-UTD but not sure when he needs another one, FH colon cancer Tetanus-UTD Shingrix-UTD History of Present Illness Alex Townsend is a 65 year old male who presents for a follow-up visit and complete physical exam.  He is managing hypertension with hydralazine , labetalol , nifedipine , Micardis, and amiloride . He did not take his blood pressure medications this morning due to not having breakfast yet. His blood pressure has been difficult to control, and he attributes some fatigue to the number of medications he is taking. He does see Nephrologist in [facility]   For rheumatoid arthritis, he is taking Enbrel and leflunomide , having previously used Arava . He continues to see his rheumatologist every six months and reports that his condition is stable. Patient seeing Dr. Mai  He is experiencing worsening knee pain, with the right knee being worse than the left. He is considering knee surgery this year and is exploring different surgical options. One option is done in [place] which has a faster recovery.   He is taking doxycycline for a sinus infection and has a few days left on the course. His symptoms have improved, noting previous facial pain.  He takes trazodone  100 mg for sleep but still experiences some sleepless nights. He  requests a check of his A1c, although he does not believe he has been prediabetic. His vision has been worsening, and he was told he has some cataracts, but not enough for surgery at this time.  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 No current chest pain or concerns about HIV.  Physical Exam CHEST: Lungs clear to auscultation bilaterally. MUSCULOSKELETAL: Right knee crepitus present.  Assessment and Plan Hypertension Well-controlled with current antihypertensive regimen. Fatigue possibly related  to medications. - Continue current antihypertensive regimen.  Rheumatoid arthritis and bilateral knee osteoarthritis Rheumatoid arthritis managed with Enbrel and leflunomide . Significant knee osteoarthritis symptoms, considering knee replacement surgery. Traditional surgery recommended by orthopedic specialist. - Continue Enbrel and leflunomide  for rheumatoid arthritis. - Consulted with orthopedic specialist regarding knee replacement surgery options.  Hyperlipidemia Treatment plan may need reassessment due to concerns about current medication regimen. - Continue current hyperlipidemia management.  Obesity (BMI 32.0-32.9) Discussed impact on knee surgery recovery and overall health. - Encouraged weight loss to improve surgical outcomes and overall health.  Vitamin D  deficiency Previously identified, currently taking supplements. Discussed importance of maintaining levels. - Continue vitamin D  supplementation. - Checked vitamin D  levels.  Chronic insomnia Managed with trazodone  100 mg, effective but not fully resolving issues. - Continue trazodone  100 mg for insomnia.  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 Reports night vision difficulty. - Continue to monitor vision changes and consider cataract surgery if condition worsens.  Acute sinusitis Recent sinusitis treated with doxycycline, symptoms improved. - Continue current course of doxycycline.  General Health Maintenance Discussed health maintenance, vaccinations, and screenings. Shingles and tetanus vaccinations up to date. Colonoscopy status uncertain, possibly due for repeat screening. - Ordered colonoscopy referral. - Ordered PSA test. - Ordered CBC, kidney, liver, A1c, thyroid, lipid, and magnesium  tests. Health Maintenance  Topic Date Due   HIV Screening  Never done   Hepatitis C Screening  Never done   COVID-19 Vaccine (1) 01/04/2025*   Flu Shot  02/12/2025*   Colon  Cancer Screening  07/16/2027   DTaP/Tdap/Td vaccine (3 - Td or Tdap) 05/27/2031   Pneumococcal Vaccine for age over 3  Completed   HPV Vaccine (No Doses Required) Completed   Zoster (Shingles) Vaccine  Completed   Hepatitis B Vaccine  Aged Out   Meningitis B Vaccine  Aged Out  *Topic was postponed. The date shown is not the original due date.    Most recent fall risk assessment:    06/18/2024   12:07 PM  Fall Risk   Falls in the past year? 0     Most recent depression screenings:    06/18/2024   12:07 PM  PHQ 2/9 Scores  PHQ - 2 Score 2  PHQ- 9 Score 10      Data saved with a previous flowsheet row definition      The 10-year ASCVD risk score (Arnett DK, et al., 2019) is: 18.6%   Values used to calculate the score:     Age: 38 years     Clinically relevant sex: Male     Is Non-Hispanic African American: Yes     Diabetic: No     Tobacco smoker: No     Systolic Blood Pressure: 138 mmHg     Is BP treated: Yes     HDL Cholesterol: 43 mg/dL     Total Cholesterol: 207  mg/dL  Past Surgical History:  Procedure Laterality Date   ANKLE SURGERY Right    CRIF of dislocated R ankle   CYSTOSCOPY W/ STONE MANIPULATION     CYSTOSCOPY WITH HOLMIUM LASER LITHOTRIPSY     POLYPECTOMY     WISDOM TOOTH EXTRACTION     Social History[1] Social History   Socioeconomic History   Marital status: Married    Spouse name: Not on file   Number of children: Not on file   Years of education: Not on file   Highest education level: Not on file  Occupational History   Not on file  Tobacco Use   Smoking status: Never   Smokeless tobacco: Never  Vaping Use   Vaping status: Never Used  Substance and Sexual Activity   Alcohol use: Not Currently   Drug use: Not Currently   Sexual activity: Not on file  Other Topics Concern   Not on file  Social History Narrative   Not on file   Social Drivers of Health   Tobacco Use: Low Risk (12/19/2024)   Patient History    Smoking Tobacco Use:  Never    Smokeless Tobacco Use: Never    Passive Exposure: Not on file  Recent Concern: Tobacco Use - Medium Risk (12/11/2024)   Received from [facility]   Patient History    Smoking Tobacco Use: Former    Smokeless Tobacco Use: Never    Passive Exposure: Not on Actuary Strain: Not on file  Food Insecurity: Low Risk (01/15/2024)   Received from [facility]   Epic    Within the past 12 months, you worried that your food would run out before you got money to buy more: Never true    Within the past 12 months, the food you bought just didn't last and you didn't have money to get more. : Never true  Transportation Needs: No Transportation Needs (01/15/2024)   Received from [facility]    In the past 12 months, has lack of reliable transportation kept you from medical appointments, meetings, work or from getting things needed for daily living? : No  Physical Activity: Not on file  Stress: Not on file  Social Connections: Not on file  Intimate Partner Violence: Not on file  Depression (PHQ2-9): Medium Risk (06/18/2024)   Depression (PHQ2-9)    PHQ-2 Score: 10  Alcohol Screen: Not on file  Housing: Low Risk (01/15/2024)   Received from [facility]   Epic    What is your living situation today?: I have a steady place to live    Think about the place you live. Do you have problems with any of the following? Choose all that apply:: None/None on this list  Utilities: Low Risk (01/15/2024)   Received from [facility]   Utilities    In the past 12 months has the electric, gas, oil, or water company threatened to shut off services in your home? : No  Health Literacy: Not on file   Family History  Problem Relation Age of Onset   Breast cancer Mother    Ovarian cancer Mother    Arthritis Father    Dementia Father    GI problems Father    Prostate cancer Father    Hypertension Father    Colon cancer Father    Breast cancer Sister    Dementia Sister     Thyroid disease Sister    Heart attack Brother    Pulmonary  embolism Brother    Allergies[2]   Patient Care Team: Aletha Bene, MD as PCP - General (Family Medicine)   Show/hide medication list[3]  ROS     Objective:    BP 138/86   Pulse 71   Ht 5' 10.5 (1.791 m)   Wt 230 lb 6 oz (104.5 kg)   SpO2 97%   BMI 32.59 kg/m  BP Readings from Last 3 Encounters:  12/19/24 138/86  06/18/24 134/86  03/17/21 (!) 145/83   Wt Readings from Last 3 Encounters:  12/19/24 230 lb 6 oz (104.5 kg)  06/18/24 231 lb 8 oz (105 kg)  03/16/21 237 lb (107.5 kg)    Physical Exam Vitals and nursing note reviewed.  Constitutional:      General: He is not in acute distress.    Appearance: Normal appearance.  HENT:     Head: Normocephalic.     Right Ear: Tympanic membrane, ear canal and external ear normal.     Left Ear: Tympanic membrane, ear canal and external ear normal.     Mouth/Throat:     Pharynx: No oropharyngeal exudate or posterior oropharyngeal erythema.  Eyes:     Extraocular Movements: Extraocular movements intact.     Conjunctiva/sclera: Conjunctivae normal.     Pupils: Pupils are equal, round, and reactive to light.  Cardiovascular:     Rate and Rhythm: Normal rate and regular rhythm.     Heart sounds: Normal heart sounds.  Pulmonary:     Effort: Pulmonary effort is normal.     Breath sounds: Normal breath sounds. No wheezing or rhonchi.  Abdominal:     Tenderness: There is no abdominal tenderness.     Hernia: No hernia is present. There is no hernia in the left inguinal area or right inguinal area.  Genitourinary:    Testes: Normal.     Epididymis:     Right: Normal.     Comments: Rectal exam deferred per patient request Musculoskeletal:     Right knee: Bony tenderness and crepitus present.     Left knee: Bony tenderness and crepitus present.     Right lower leg: No edema.     Left lower leg: No edema.  Skin:    General: Skin is warm and dry.  Neurological:      General: No focal deficit present.     Mental Status: He is alert and oriented to person, place, and time. Mental status is at baseline.     Cranial Nerves: Cranial nerves 2-12 are intact. No cranial nerve deficit.     Motor: Motor function is intact. No weakness.     Coordination: Romberg sign negative. Coordination normal. Finger-Nose-Finger Test normal. Rapid alternating movements normal.     Gait: Gait and tandem walk normal.     Deep Tendon Reflexes: Reflexes are normal and symmetric. Reflexes normal.  Psychiatric:        Mood and Affect: Mood normal.        Behavior: Behavior normal.        Thought Content: Thought content normal.        Judgment: Judgment normal.      No results found for any visits on 12/19/24.      Bene Aletha, MD     [1]  Social History Tobacco Use   Smoking status: Never   Smokeless tobacco: Never  Vaping Use   Vaping status: Never Used  Substance Use Topics   Alcohol use: Not Currently   Drug use: Not  Currently  [2]  Allergies Allergen Reactions   Amoxicillin-Pot Clavulanate Diarrhea    Other reaction(s): GI Upset (intolerance)    Ramipril Cough  [3]  Outpatient Medications Prior to Visit  Medication Sig   aMILoride  (MIDAMOR ) 5 MG tablet Take 5 mg by mouth daily.   atorvastatin  (LIPITOR) 40 MG tablet TAKE 1 TABLET BY MOUTH EVERY DAY   cetirizine (ZYRTEC) 10 MG tablet Take 10 mg by mouth daily.   doxycycline (VIBRA-TABS) 100 MG tablet Take 100 mg by mouth 2 (two) times daily.   Efinaconazole  (JUBLIA ) 10 % SOLN APPLY TO THE AFFECTED AREA DAILY AS DIRECTED   ENBREL SURECLICK 50 MG/ML injection Inject 50 mg into the skin once a week.   ferrous sulfate 325 (65 FE) MG tablet Take 325 mg by mouth daily with breakfast.   fluticasone (FLONASE) 50 MCG/ACT nasal spray Place 1 spray into both nostrils daily.   hydrALAZINE  (APRESOLINE ) 50 MG tablet Take 50 mg by mouth 3 (three) times daily.   leflunomide  (ARAVA ) 20 MG tablet Take 20 mg by  mouth daily.   NIFEdipine  (ADALAT  CC) 90 MG 24 hr tablet Take 90 mg by mouth daily.   telmisartan (MICARDIS) 80 MG tablet Take 80 mg by mouth daily.   traZODone  (DESYREL ) 100 MG tablet TAKE 1 TABLET BY MOUTH EVERYDAY AT BEDTIME   TURMERIC CURCUMIN PO Take by mouth.   [DISCONTINUED] labetalol  (NORMODYNE ) 200 MG tablet Take 200 mg by mouth 2 (two) times daily.   [DISCONTINUED] traZODone  (DESYREL ) 50 MG tablet Take 1 tablet (50 mg total) by mouth at bedtime. (Patient not taking: Reported on 12/19/2024)   [DISCONTINUED] Vibegron (GEMTESA) 75 MG TABS Take 75 mg by mouth. (Patient not taking: Reported on 06/18/2024)   No facility-administered medications prior to visit.   "

## 2024-12-20 LAB — CBC WITH DIFFERENTIAL/PLATELET
Absolute Lymphocytes: 1454 {cells}/uL (ref 850–3900)
Absolute Monocytes: 699 {cells}/uL (ref 200–950)
Basophils Absolute: 51 {cells}/uL (ref 0–200)
Basophils Relative: 1.1 %
Eosinophils Absolute: 110 {cells}/uL (ref 15–500)
Eosinophils Relative: 2.4 %
HCT: 42.4 % (ref 39.4–51.1)
Hemoglobin: 12.6 g/dL — ABNORMAL LOW (ref 13.2–17.1)
MCH: 22.7 pg — ABNORMAL LOW (ref 27.0–33.0)
MCHC: 29.7 g/dL — ABNORMAL LOW (ref 31.6–35.4)
MCV: 76.4 fL — ABNORMAL LOW (ref 81.4–101.7)
MPV: 10.1 fL (ref 7.5–12.5)
Monocytes Relative: 15.2 %
Neutro Abs: 2286 {cells}/uL (ref 1500–7800)
Neutrophils Relative %: 49.7 %
Platelets: 314 10*3/uL (ref 140–400)
RBC: 5.55 Million/uL (ref 4.20–5.80)
RDW: 13.7 % (ref 11.0–15.0)
Total Lymphocyte: 31.6 %
WBC: 4.6 10*3/uL (ref 3.8–10.8)

## 2024-12-20 LAB — LIPID PANEL
Cholesterol: 142 mg/dL
HDL: 44 mg/dL
LDL Cholesterol (Calc): 77 mg/dL
Non-HDL Cholesterol (Calc): 98 mg/dL
Total CHOL/HDL Ratio: 3.2 (calc)
Triglycerides: 120 mg/dL

## 2024-12-20 LAB — HEMOGLOBIN A1C
Hgb A1c MFr Bld: 4.7 %
Mean Plasma Glucose: 88 mg/dL
eAG (mmol/L): 4.9 mmol/L

## 2024-12-20 LAB — COMPREHENSIVE METABOLIC PANEL WITH GFR
AG Ratio: 1.8 (calc) (ref 1.0–2.5)
ALT: 16 U/L (ref 9–46)
AST: 18 U/L (ref 10–35)
Albumin: 4.3 g/dL (ref 3.6–5.1)
Alkaline phosphatase (APISO): 77 U/L (ref 35–144)
BUN: 17 mg/dL (ref 7–25)
CO2: 27 mmol/L (ref 20–32)
Calcium: 9.3 mg/dL (ref 8.6–10.3)
Chloride: 103 mmol/L (ref 98–110)
Creat: 1.12 mg/dL (ref 0.70–1.35)
Globulin: 2.4 g/dL (ref 1.9–3.7)
Glucose, Bld: 87 mg/dL (ref 65–99)
Potassium: 3.6 mmol/L (ref 3.5–5.3)
Sodium: 139 mmol/L (ref 135–146)
Total Bilirubin: 0.6 mg/dL (ref 0.2–1.2)
Total Protein: 6.7 g/dL (ref 6.1–8.1)
eGFR: 73 mL/min/{1.73_m2}

## 2024-12-20 LAB — MAGNESIUM: Magnesium: 1.8 mg/dL (ref 1.5–2.5)

## 2024-12-20 LAB — PSA: PSA: 0.88 ng/mL

## 2024-12-20 LAB — VITAMIN D 25 HYDROXY (VIT D DEFICIENCY, FRACTURES): Vit D, 25-Hydroxy: 43 ng/mL (ref 30–100)

## 2024-12-20 LAB — TSH: TSH: 0.95 m[IU]/L (ref 0.40–4.50)

## 2024-12-21 ENCOUNTER — Ambulatory Visit: Payer: Self-pay | Admitting: Family Medicine

## 2025-12-20 ENCOUNTER — Encounter: Admitting: Family Medicine
